# Patient Record
Sex: Male | Born: 1935 | Race: White | Hispanic: No | Marital: Married | Smoking: Former smoker
Health system: Southern US, Community
[De-identification: ages and names within clinical notes are randomized; demographics above are authoritative.]

## PROBLEM LIST (undated history)

## (undated) DIAGNOSIS — B019 Varicella without complication: Secondary | ICD-10-CM

## (undated) DIAGNOSIS — E119 Type 2 diabetes mellitus without complications: Secondary | ICD-10-CM

## (undated) DIAGNOSIS — H8109 Meniere's disease, unspecified ear: Secondary | ICD-10-CM

## (undated) HISTORY — DX: Meniere's disease, unspecified ear: H81.09

## (undated) HISTORY — DX: Type 2 diabetes mellitus without complications: E11.9

## (undated) HISTORY — PX: CATARACT EXTRACTION: SUR2

## (undated) HISTORY — PX: MUSCLE BIOPSY: SHX716

## (undated) HISTORY — DX: Varicella without complication: B01.9

---

## 2004-05-16 LAB — HM COLONOSCOPY

## 2008-04-02 ENCOUNTER — Ambulatory Visit: Payer: Self-pay | Admitting: Unknown Physician Specialty

## 2008-11-23 ENCOUNTER — Ambulatory Visit: Payer: Self-pay | Admitting: Family Medicine

## 2008-12-08 ENCOUNTER — Ambulatory Visit: Payer: Self-pay | Admitting: Family Medicine

## 2012-10-28 ENCOUNTER — Ambulatory Visit: Payer: Self-pay | Admitting: Ophthalmology

## 2012-11-25 ENCOUNTER — Ambulatory Visit: Payer: Self-pay | Admitting: Ophthalmology

## 2013-04-14 ENCOUNTER — Emergency Department: Payer: Self-pay | Admitting: Emergency Medicine

## 2014-06-29 DIAGNOSIS — G7102 Facioscapulohumeral muscular dystrophy: Secondary | ICD-10-CM | POA: Insufficient documentation

## 2014-06-29 DIAGNOSIS — R202 Paresthesia of skin: Secondary | ICD-10-CM | POA: Insufficient documentation

## 2014-08-23 ENCOUNTER — Encounter: Payer: Self-pay | Admitting: Neurology

## 2014-08-23 ENCOUNTER — Ambulatory Visit (INDEPENDENT_AMBULATORY_CARE_PROVIDER_SITE_OTHER): Payer: 59 | Admitting: Neurology

## 2014-08-23 VITALS — BP 118/70 | HR 75 | Ht 68.0 in | Wt 140.4 lb

## 2014-08-23 DIAGNOSIS — M5412 Radiculopathy, cervical region: Secondary | ICD-10-CM

## 2014-08-23 DIAGNOSIS — G830 Diplegia of upper limbs: Secondary | ICD-10-CM

## 2014-08-23 DIAGNOSIS — G1221 Amyotrophic lateral sclerosis: Secondary | ICD-10-CM

## 2014-08-23 DIAGNOSIS — G1220 Motor neuron disease, unspecified: Secondary | ICD-10-CM

## 2014-08-23 LAB — C-REACTIVE PROTEIN

## 2014-08-23 LAB — CK: Total CK: 79 U/L (ref 7–232)

## 2014-08-23 MED ORDER — DIAZEPAM 5 MG PO TABS
ORAL_TABLET | ORAL | Status: DC
Start: 2014-08-23 — End: 2015-06-27

## 2014-08-23 MED ORDER — GABAPENTIN 100 MG PO CAPS: ORAL_CAPSULE | ORAL | Status: DC

## 2014-08-23 NOTE — Patient Instructions (Signed)
1.  Check blood work today 2.  MRI of the cervical spine 3.  Start taking gabapentin 100mg  at bedtime for one week, then increase to 2 tablets at bedtime 4.  Return to clinic in 6 weeks

## 2014-08-23 NOTE — Progress Notes (Signed)
[facility] Clinic Note - Initial Visit   Date: 08/25/2014  Scott Crane MRN: [id number] DOB: 1929/12/19   Dear Dr. Melrose Nakayama:  Thank you for your kind referral of Scott Crane for consultation of bilateral hand paresthesias and arm weakness. Although his history is well known to you, please allow [place] to reiterate it for the purpose of our medical record. The patient was accompanied to the clinic by his wife and daughter who also provides collateral information.     History of Present Illness: Scott Crane is a 80 y.o. right-handed Caucasian male with diet-controlled diabetes mellitus type 2 (off medications, HbA1c 5.6), Mnire's disesase, bilateral cataracts, and degenerative arthritis presenting for evaluation of bilateral hand paresthesias and arm weakness.  Patient has a somewhat complicated neurological history regarding his arm weakness, which dates back to when he was in his 46s. He started noticing weakness of his proximal arms, and recalls having difficulty when trying to paint his home especially with higher areas. His weakness steadily worsened over the years and he also developed muscle atrophy of his proximal muscles. During the 1990s, he saw multiple physicians for evaluation. Patient provided a letter by Dr. Lavonna Rua at [facility] dated 06/29/1997 which noted that cervical canal and foraminal stenosis at that time was insufficient to explain the degree of weakness patient was experiencing and surgical decompression would likely make him worse, than better. At that time, focal motor neuron disease was considered, however since there has been no notable progression, ALS was thought to be less likely. Another letter dated 06/16/1997 by Dr. Billie Ruddy (neurosurgery) noted that patient had seen a number of doctors at both [facility] and [facility] which at that time predominantly affected the deltoid and spared the supraspinatus  muscles and biceps. He had a number of EMGs and underwent muscle biopsy which apparently ruled out muscular dystrophy. EMGs have shown a denervation pattern. At that time, he was having a pure motor problem affecting the C4-to C8 myotomes, without any sensory deficits. Since then, patient has been steadily getting worse over the years and now his weakness involves more of his proximal girdle muscles.   He had not seen a neurologist an number of years.  However in February 2015, suffered a fall after loosing his balance and was trying to sit in a chair, but landed on the floor very hard.  A few months later, he developing tingling and numbness of the right hand, worse over the thumb, index finger, and 3rd digit. He has problems with fine finger movements and manipulating small objects.  Paresthesias are constant and is worse with pronation and supination of the wrist.  He also developed new weakness of the hands.  He has no occupational therapy this year.  Numbness/tingling has steadily worsened over the past 71-month and become even worse especially over the past month, for which he went to see Dr. 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 Besides carpal tunnel syndrome, sensory responses appeared normal. Dr. PMelrose Nakayamawas also concerned about possible diagnosis of fascioscapulohumeral muscular dystrophy and referred patient here for further evaluation and management.  Currently, he complains of radicular pain starting in the neck and radiating into his hands and weakness of his hands, which was previously not present prior to his fall. He denies any problems with talking, swallowing, shortness of breath, or leg weakness. He is dependent for most  of his ADLs because of the severe weakness of his arms.  Out-side paper records, electronic medical record, and images have been  reviewed where available and summarized as:  Labs 05/06/1997: CK 92, ESR 22  EMG 05/20/1997 performed at [facility]: This study is abnormal, interpreted in conjunction with the previous study of 05/10/97. The left biceps and deltoid are severely denervated. No myopathic features were noted. This study is consistent with a severe C5-6 radiculopathic process.  This is an abnormal study. The pattern of denervation is most consistent with acute and chronic bilateral mid-cervical radiculopathy. 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   Past Medical History  Diagnosis Date  . Chickenpox   . Diabetes   . Meniere's disease     Past Surgical History  Procedure Laterality Date  . Cataract extraction       Medications:  Celebrex 200 mg daily Magnesium oxide 400 mg daily Mobic7.5 mg daily daily as needed for pain  Ultram 50 mg every 6 hours as needed for pain   Allergies: No Known Allergies  Family History: Family History  Problem Relation Age of Onset  . Stroke Mother     Social History: History   Social History  . Marital Status: Married    Spouse Name: N/A    Number of Children: N/A  . Years of Education: N/A   Occupational History  . Not on file.   Social History Main Topics  . Smoking status: Former Smoker -- 5 years  . Smokeless tobacco: Never Used  . Alcohol Use: No  . Drug Use: No  . Sexual Activity: Not on file   Other Topics Concern  . Not on file   Social History Narrative   Lives with wife in one story home.  Retired from [employer].  Education: some Teacher, music.    Review of Systems:  CONSTITUTIONAL: No fevers, chills, night sweats, or weight loss.   EYES: No visual changes or eye pain ENT: No hearing changes.  No history of nose bleeds.   RESPIRATORY: No cough, wheezing and shortness of breath.     CARDIOVASCULAR: Negative for chest pain, and palpitations.   GI: Negative for abdominal discomfort, blood in stools or black stools.  No recent change in bowel habits.   GU:  No history of incontinence.   MUSCLOSKELETAL: +history of joint pain or swelling.  No myalgias.   SKIN: Negative for lesions, rash, and itching.   HEMATOLOGY/ONCOLOGY: Negative for prolonged bleeding, bruising easily, and swollen nodes.  No history of cancer.   ENDOCRINE: Negative for cold or heat intolerance, polydipsia or goiter.   PSYCH:  No depression or anxiety symptoms.   NEURO: As Above.   Vital Signs:  BP 118/70 mmHg  Pulse 75  Ht 5' 8" (1.727 m)  Wt 140 lb 7 oz (63.702 kg)  BMI 21.36 kg/m2  SpO2 97%   General Medical Exam:   General:   Very thin-appearing gentleman, comfortable.  Eyes/ENT: see cranial nerve examination.   Neck: No masses appreciated.  Full range of motion without tenderness.  No carotid bruits. Respiratory:  Clear to auscultation, good air entry bilaterally.   Cardiac:  Regular rate and rhythm, no murmur.   Extremities:  No deformities, edema, or skin discoloration.  Skin:  No rashes or lesions.  Neurological Exam: MENTAL STATUS including orientation to time, place, person, recent and remote memory, attention  span and concentration, language, and fund of knowledge is normal.  Speech is not dysarthric.  Lingual and gutteral sounds are normal.  CRANIAL NERVES: II:  No visual field defects.  Unremarkable fundi.   III-IV-VI: Pupils equal round and reactive to light.  Normal conjugate, extra-ocular eye movements in all directions of gaze.  No nystagmus.  No ptosis.   V:  Normal facial sensation.  Jaw jerk is absent VII:  Normal facial symmetry and movements.  Frontalis, oribicularis oculi, orbicularis oris, and buccinator is 5/5.  Snout and palmomental reflex is absent.  Myerson's sign is present. VIII:  Reduced finger rub bilaterally (L >R).     IX-X:  Normal palatal movement.   XI:   Normal shoulder shrug and head rotation.   XII:  Normal tongue strength and range of motion, no deviation or fasciculation.  MOTOR:  Very severe muscle atrophy of the periscapular muscles (supraspinatus, infraspinatus, medial pectoralis, paraspinal muscles, biceps) and cervical as well as thoracic paraspinal muscles.  Rare intermittent fasiculations of the right forearm muscles.   Tone is reduced in the arms and normal in the legs..    Right Upper Extremity:    Left Upper Extremity:    Deltoid  1/5   Deltoid  1/5   Biceps  1/5   Biceps  1/5   Infraspinatus 0/5  Infraspinatus 0/5  Medial pectoralis 3/5  Medial pectoralis 4-/5  Serratus anterior 0/5  Serratus anterior 0/5  Brachioradialis 1/5  Brachioradialis 2/5  Triceps  5-/5   Triceps  5-/5   Wrist extensors  5/5   Wrist extensors  5/5   Wrist flexors  5/5   Wrist flexors  5/5   Finger extensors  5/5   Finger extensors  5/5   Finger flexors  5/5   Finger flexors  5/5   Dorsal interossei  4+/5   Dorsal interossei  5-/5   Abductor pollicis  5-/5   Abductor pollicis  5-/5   Tone (Ashworth scale)  0  Tone (Ashworth scale)  0   Right Lower Extremity:    Left Lower Extremity:    Hip flexors  5/5   Hip flexors  5/5   Hip extensors  5/5   Hip extensors  5/5   Adductor 5/5  Adductor 5/5  Abductor 5/5  Abductor 5/5  Knee flexors  5/5   Knee flexors  5/5   Knee extensors  5/5   Knee extensors  5/5   Dorsiflexors  5/5   Dorsiflexors  5/5   Plantarflexors  5/5   Plantarflexors  5/5   Toe extensors  5/5   Toe extensors  5/5   Toe flexors  5/5   Toe flexors  5/5   Tone (Ashworth scale)  0  Tone (Ashworth scale)  0   MSRs:  Right                                                                 Left brachioradialis 2+  brachioradialis 1+  biceps 2+  biceps 3+  triceps 3+  triceps 3+  patellar 3+  patellar 2+  ankle jerk 1+  ankle jerk 1+  Hoffman no  Hoffman no  plantar response down  plantar response up  Right crossed adductors Biceps  reflex  initiates shoulder shrug and medial pectoralis spread  SENSORY:  Vibration reduced over the hands and ankles bilaterally. Pinprick is reduced in the forearms bilaterally.   COORDINATION/GAIT:  He is unable to perform finger to nose. Finger tapping is intact bilaterally.  Gait narrow based and stable, there is no arm swing due to weakness.    IMPRESSION: Scott Crane is an 80 year-old gentleman presenting for evaluation of bilateral proximal arm weakness and muscle atrophy with new symptoms of cervical radicular pain. His history is somewhat complicated and he has had extensive workup including EMGs and muscle biopsy for his muscle atrophy and weakness which has remained unclear. 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 It appears that there has been no evidence of myopathy on any of his EMGs or muscle biopsy. Additionally, his CKs have always been normal. With this in mind, a muscular dystrophy such as FSHD would be less likely.  Further, there is no family history for this, facial muscles are intact, and early in the disease cause deltoid muscles are usually spared which is not the case here since this was a presenting symptom.  More so, given the severe muscle atrophy, I would expect his reflexes to be low, but instead, his reflexes are actually increased. This may be due to underlying cervical canal stenosis, however involvement of upper motor neurons must also be considered. 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 This variant tends to be rare, slowly progressive and primarily lower motor neuron with without always progressive to involve the face, respiratory muscles, or legs. Repeat electrodiagnostic testing of the face, arm, and legs may be indicated going forward.  At this time, his primary complaint is severe pain and paresthesias of his  upper extremities. The characteristic of his pain appears to be more radicular in quality so I would like to obtain updated imaging of the cervical spine. I would not be surprised if there is new focal nerve root impingement.   PLAN/RECOMMENDATIONS:  1.  Check CK, ESR, CRP, TSH, vitamin B12, copper, zinc, vitamin E 2.  MRI of the cervical spine 3.  Start gabapentin 100 mg at bedtime for one week, then increase to 2 tablets at bedtime. Up titrate as tolerable. 4.  Consider neck PT and/or OT going forward 5.  Return to clinic in 6 weeks    The duration of this appointment visit was 80 minutes of face-to-face time with the patient.  Greater than 50% of this time was spent in counseling, explanation of diagnosis, planning of further management, and coordination of care.   Thank you for allowing me to participate in patient's care.  If I can answer any additional questions, I would be pleased to do so.    Sincerely,    Donika K. Posey Pronto, DO

## 2014-08-24 LAB — SEDIMENTATION RATE: Sed Rate: 4 mm/hr (ref 0–16)

## 2014-08-24 LAB — TSH: TSH: 0.932 u[IU]/mL (ref 0.350–4.500)

## 2014-08-24 LAB — VITAMIN B12: Vitamin B-12: 389 pg/mL (ref 211–911)

## 2014-08-25 DIAGNOSIS — G1221 Amyotrophic lateral sclerosis: Secondary | ICD-10-CM | POA: Insufficient documentation

## 2014-08-25 LAB — COPPER, SERUM: Copper: 91 ug/dL (ref 70–175)

## 2014-08-26 LAB — ZINC: ZINC: 63 ug/dL (ref 60–130)

## 2014-08-26 LAB — VITAMIN E
Gamma-Tocopherol (Vit E): 1.2 mg/L (ref <=4.3)
VITAMIN E (ALPHA TOCOPHEROL): 11.1 mg/L (ref 5.7–19.9)

## 2014-08-26 NOTE — Progress Notes (Signed)
Note faxed.

## 2014-09-05 ENCOUNTER — Ambulatory Visit: Payer: Self-pay | Admitting: Neurology

## 2014-09-16 ENCOUNTER — Telehealth: Payer: Self-pay | Admitting: *Deleted

## 2014-09-16 NOTE — Telephone Encounter (Signed)
Patient requesting result from his most recent imaging done at [facility] Call back number [phone] [facility])

## 2014-09-16 NOTE — Telephone Encounter (Signed)
Did you get these results yet?

## 2014-09-16 NOTE — Telephone Encounter (Signed)
MRI cervical spine wo contrast 09/05/2014 from [facility]: 1.  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 2.  Slight spinal cord compression due to disc osteophyte complex at c3-4 asymmetric to the right.  Te right C4 nerves could be affected. 3.  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  Results discuss with Vickie and requested to bring CD to their f/u appointment.  Talia Hoheisel K. Allena Katz, DO

## 2014-09-16 NOTE — Telephone Encounter (Signed)
No results rec'd.  Can you call [facility] and ask them to fax it?

## 2014-09-27 ENCOUNTER — Ambulatory Visit: Payer: Self-pay | Admitting: Family Medicine

## 2014-10-04 ENCOUNTER — Ambulatory Visit: Payer: Self-pay | Admitting: Neurology

## 2014-11-07 ENCOUNTER — Encounter: Payer: Self-pay | Admitting: Neurology

## 2014-11-07 ENCOUNTER — Ambulatory Visit (INDEPENDENT_AMBULATORY_CARE_PROVIDER_SITE_OTHER): Payer: 59 | Admitting: Neurology

## 2014-11-07 VITALS — BP 118/80 | HR 73 | Ht 68.0 in | Wt 143.4 lb

## 2014-11-07 DIAGNOSIS — M4712 Other spondylosis with myelopathy, cervical region: Secondary | ICD-10-CM

## 2014-11-07 DIAGNOSIS — M5412 Radiculopathy, cervical region: Secondary | ICD-10-CM

## 2014-11-07 DIAGNOSIS — G1221 Amyotrophic lateral sclerosis: Secondary | ICD-10-CM

## 2014-11-07 MED ORDER — GABAPENTIN 100 MG PO CAPS: 100.0000 mg | ORAL_CAPSULE | Freq: Three times a day (TID) | ORAL | Status: DC

## 2014-11-07 NOTE — Patient Instructions (Signed)
1.  We will send a referral to Physical Medicine and Rehab doctors  2.  Return to clinic as needed

## 2014-11-07 NOTE — Progress Notes (Signed)
Follow-up Visit   Date: 11/07/2014   MARSALIS BEAULIEU MRN: [id number] DOB: November 18, 1929   Interim History: Scott Crane is a 81 y.o. right-handed Caucasian male with diet-controlled diabetes mellitus type 2 (off medications, HbA1c 5.6), Mnire's disesase, bilateral cataracts, and degenerative arthritis returning to the clinic for follow-up of bilateral hand weakness and neck pain.  The patient was accompanied to the clinic by wife and daughter who also provides collateral information.    History of present illness: Patient has a somewhat complicated neurological history regarding his arm weakness, which dates back to when he was in his 16s. He started noticing weakness of his proximal arms, and recalls having difficulty when trying to paint his home especially with higher areas. His weakness steadily worsened over the years and he also developed muscle atrophy of his proximal muscles. During the 1990s, he saw multiple physicians for evaluation. Patient provided a letter by Dr. Lavonna Rua at [facility] dated 06/29/1997 which noted that cervical canal and foraminal stenosis at that time was insufficient to explain the degree of weakness patient was experiencing and surgical decompression would likely make him worse, than better. At that time, focal motor neuron disease was considered, however since there has been no notable progression, ALS was thought to be less likely. Another letter dated 06/16/1997 by Dr. Billie Ruddy (neurosurgery) noted that patient had seen a number of doctors at both [facility] and [place] centers which at that time predominantly affected the deltoid and spared the supraspinatus muscles and biceps. He had a number of EMGs and underwent muscle biopsy which apparently ruled out muscular dystrophy. EMGs have shown a denervation pattern. At that time, he was having a pure motor problem affecting the C4-to C8 myotomes, without any sensory deficits. Since then, patient  has been steadily getting worse over the years and now his weakness involves more of his proximal girdle muscles.   He had not seen a neurologist an number of years. However in February 2015, suffered a fall after loosing his balance and was trying to sit in a chair, but landed on the floor very hard. A few months later, he developing tingling and numbness of the right hand, worse over the thumb, index finger, and 3rd digit. He has problems with fine finger movements and manipulating small objects. Paresthesias are constant and is worse with pronation and supination of the wrist. He also developed new weakness of the hands. He has no occupational therapy this year. Numbness/tingling has steadily worsened over the past 44-month and become even worse especially over the past month, for which he went to see Dr. 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 Besides carpal tunnel syndrome, sensory responses appeared normal. Dr. PMelrose Nakayamawas also concerned about possible diagnosis of fascioscapulohumeral muscular dystrophy and referred patient here for further evaluation and management.  He complains of radicular pain starting in the neck and radiating into his hands and weakness of his hands, which was previously not present prior to his fall. He denies any problems with talking, swallowing, shortness of breath, or leg weakness. He is dependent for most of his ADLs because of the severe weakness of his arms  UPDATE 11/07/2014: Patient underwent MRI cervical spine in December 2015 which showed cord compression at C1-2 with myelopathy which is most likely causing his new radicular pain involving the neck and hands.  He feels that symptoms have overall stabilized without  any worsening since his last visit.  He continues to have severe radicular pain involving the neck and low back,  numbness involving the thumb and index fingers, and weakness with elbow flexion. Since starting neurontin, he wakes up several times at night to urinate and is wondering if this could be a medication effect.  Medications:  Current Outpatient Prescriptions on File Prior to Visit  Medication Sig Dispense Refill  . acetaminophen (RA ACETAMINOPHEN) 650 MG CR tablet Take by mouth.    . celecoxib (CELEBREX) 200 MG capsule Take by mouth.    . diazepam (VALIUM) 5 MG tablet Take one tablet 30-min prior to MRI. 2 tablet 0  . Glucosamine-Chondroit-Vit C-Mn (GLUCOSAMINE-CHONDROITIN) CAPS Take by mouth.    . magnesium oxide (MAG-OX) 400 MG tablet Take by mouth.    . meclizine (ANTIVERT) 25 MG tablet Take by mouth.    . meloxicam (MOBIC) 7.5 MG tablet Take by mouth.    . Probiotic Product (PROBIOTIC DAILY PO) Take by mouth.    . traMADol (ULTRAM) 50 MG tablet Take by mouth.     No current facility-administered medications on file prior to visit.    Allergies: No Known Allergies  Review of Systems:  CONSTITUTIONAL: No fevers, chills, night sweats, or weight loss.  EYES: No visual changes or eye pain ENT: No hearing changes.  No history of nose bleeds.   RESPIRATORY: No cough, wheezing and shortness of breath.   CARDIOVASCULAR: Negative for chest pain, and palpitations.   GI: Negative for abdominal discomfort, blood in stools or black stools.  No recent change in bowel habits.   GU:  No history of incontinence.   MUSCLOSKELETAL: +history of joint pain or swelling.  No myalgias.   SKIN: Negative for lesions, rash, and itching.   ENDOCRINE: Negative for cold or heat intolerance, polydipsia or goiter.   PSYCH:  No depression or anxiety symptoms.   NEURO: As Above.   Vital Signs:  BP 118/80 mmHg  Pulse 73  Ht _0  (1.727 m)  Wt 143 lb 6 oz (65.034 kg)  BMI 21.80 kg/m2  SpO2 97%  Neurological Exam: MENTAL STATUS including orientation to time, place, person, recent and remote memory, attention  span and concentration, language, and fund of knowledge is normal.  Very talkative, speech is not dysarthric.  CRANIAL NERVES:  Pupils equal round and reactive to light.  Normal conjugate, extra-ocular eye movements in all directions of gaze.  No ptosis. Normal facial sensation.  Face is symmetric and facial muscles are intact. Palate elevates symmetrically.  Tongue is midline.  MOTOR:  Very severe muscle atrophy of the periscapular muscles (supraspinatus, infraspinatus, medial pectoralis, paraspinal muscles, biceps) and cervical as well as thoracic paraspinal muscles. Rare intermittent fasiculations of the right forearm muscles.   Tone is reduced in the arms and normal in the legs..      Right Upper Extremity:       Left Upper Extremity:      Deltoid   1/5     Deltoid   1/5    Biceps   1/5     Biceps   1/5    Infraspinatus  0/5    Infraspinatus  0/5   Medial pectoralis  3/5    Medial pectoralis  4-/5   Serratus anterior  0/5    Serratus anterior  0/5   Brachioradialis  1/5    Brachioradialis  2/5   Triceps   5-/5     Triceps   5-/5  Wrist extensors   5/5     Wrist extensors   5/5    Wrist flexors   5/5     Wrist flexors   5/5    Finger extensors   5/5     Finger extensors   5/5    Finger flexors   5/5     Finger flexors   5/5    Dorsal interossei   4+/5     Dorsal interossei   5-/5    Abductor pollicis   5-/5     Abductor pollicis   5-/5    Tone (Ashworth scale)   0    Tone (Ashworth scale)   0      Right Lower Extremity:       Left Lower Extremity:      Hip flexors   5/5     Hip flexors   5/5    Hip extensors   5/5     Hip extensors   5/5    Adductor  5/5    Adductor  5/5   Abductor  5/5    Abductor  5/5   Knee flexors   5/5     Knee flexors   5/5    Knee extensors   5/5     Knee extensors   5/5    Dorsiflexors   5/5     Dorsiflexors   5/5    Plantarflexors   5/5     Plantarflexors   5/5    Toe extensors   5/5     Toe extensors   5/5    Toe flexors   5/5     Toe flexors   5/5      Tone (Ashworth scale)   0    Tone (Ashworth scale)   0    MSRs:   Right                                                                 Left brachioradialis  2+    brachioradialis  1+   biceps  2+    biceps  3+   triceps  3+    triceps  3+   patellar  3+    patellar  2+   ankle jerk  1+    ankle jerk  1+   Hoffman  no    Hoffman  no   plantar response  down    plantar response  up   Right crossed adductors Biceps reflex initiates shoulder shrug and medial pectoralis spread  SENSORY:  Vibration reduced over the hands and ankles bilaterally. Pinprick is reduced in the forearms bilaterally.    COORDINATION/GAIT:   Gait narrow based and stable, there is no arm swing due to weakness.    Data: Labs 05/06/1997: CK 92, ESR 22  Labs 08/23/2014:  CK 79, ESR 4, CRP <0.5, TSH 0.932, vitamin B12 389, zinc 63, copper 91, vitamin E 1.2  EMG 05/20/1997 performed at [facility]: This study is abnormal, interpreted in conjunction with the previous study of 05/10/97. The left biceps and deltoid are severely denervated. No myopathic features were noted. This study is consistent with a severe C5-6 radiculopathic process.  This is an abnormal study. The pattern of denervation is most  consistent with acute and chronic bilateral mid-cervical radiculopathy. 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  MRI cervical spine wo contrast 09/05/2014 from [facility]: 1. 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 2. Slight spinal cord compression due to disc osteophyte complex at c3-4 asymmetric to the right. The right C4 nerves could be affected. 3. Auto fusion of C4-5 and C5-6  IMPRESSION: Mr. Sprinkle is an 81 year-old gentleman returning for evaluation of bilateral proximal arm weakness and muscle atrophy with new symptoms of cervical  radicular pain. His history is somewhat complicated and he has had extensive workup including EMGs and muscle biopsy for his muscle atrophy and weakness which has remained unclear. 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 It appears that there has been no evidence of myopathy on any of his EMGs or muscle biopsy. Additionally, his CKs have always been normal. With this in mind, a muscular dystrophy such as FSHD would be less likely. Further, there is no family history for this, facial muscles are intact, and early in the disease cause deltoid muscles are usually spared which is not the case here since this was a presenting symptom.   More so, given the severe muscle atrophy, I would expect his reflexes to be low, but instead, his reflexes are actually increased. This is due to underlying cervical canal stenosis. 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 This variant tends to be rare, slowly progressive and primarily lower motor neuron with without always progressive to involve the face, respiratory muscles, or legs.   He does have new complains of neck pain, upper extremity paresthesias, and weakness for which imaging of the cervical spine was obtained and shows cord compression at C1-2 and myelopathy at C2-3 and C5-6.  His current neck pain, paresthesias, and progressive weakness of elbow flexion is due to cervical canal stenosis.  The severity of this is unlikely to be amenable to surgery and patient is not interested in seeking surgical opinion at this time. Instead, I will pursue conservative therapy and refer him to PM&R for evaluation and management given the complexity of his presentation and weakness.  I explained that the goal of therapy going forward is to limit further weakness and control pain.     PLAN/RECOMMENDATIONS:  1.  Referral to PM&R  given his complex history prior to PT 2.  OK to hold neurontin for one week to see if this helps urinary symptoms, but given low dose, this is unlikely to be a medication effect.  If no change, increase neurontin to 114m three times daily 3.  Return to clinic as needed   The duration of this appointment visit was 35 minutes of face-to-face time with the patient.  Greater than 50% of this time was spent in counseling, explanation of diagnosis, planning of further management, and coordination of care.   Thank you for allowing me to participate in patient's care.  If I can answer any additional questions, I would be pleased to do so.    Sincerely,    Abanoub Hanken K. PPosey Pronto DO

## 2014-11-07 NOTE — Progress Notes (Signed)
Note faxed.

## 2014-11-15 ENCOUNTER — Telehealth: Payer: Self-pay | Admitting: Neurology

## 2014-11-15 NOTE — Telephone Encounter (Signed)
Vicki is returning your call  °

## 2014-11-15 NOTE — Telephone Encounter (Signed)
Pt daughter [name] called and needs to talk to someone about a referral that we did for her father. Please call [name] at [phone]

## 2014-11-15 NOTE — Telephone Encounter (Signed)
Called [name] and left message for her to call me back.

## 2014-11-16 NOTE — Telephone Encounter (Signed)
[facility] denied patient and suggested that he go back to [facility].  His daughter called and said that she thinks that he may just need to see a hand specialist.  She also said that [facility] is too far for them to drive.  Can I send them to [facility]?  Please advise.

## 2014-11-16 NOTE — Telephone Encounter (Signed)
Notified Vickie that I will fax over referral to [facility].

## 2014-11-16 NOTE — Telephone Encounter (Signed)
Yes, that works. Scott Round K. Allena Katz, DO

## 2014-11-17 ENCOUNTER — Telehealth: Payer: Self-pay | Admitting: *Deleted

## 2014-11-17 NOTE — Telephone Encounter (Signed)
[facility] called to schedule with this patient today but hi daughter (vicky) would like to be seen in [place]

## 2014-11-18 ENCOUNTER — Other Ambulatory Visit: Payer: Self-pay | Admitting: *Deleted

## 2014-11-18 DIAGNOSIS — R29898 Other symptoms and signs involving the musculoskeletal system: Secondary | ICD-10-CM

## 2014-11-18 NOTE — Telephone Encounter (Signed)
Note faxed to [place].  Vickie aware.

## 2014-12-06 ENCOUNTER — Encounter
Admit: 2014-12-06 | Disposition: A | Discharge status: Home / Self Care | Payer: Self-pay | Attending: Neurology | Admitting: Neurology

## 2014-12-09 ENCOUNTER — Encounter
Admit: 2014-12-09 | Disposition: A | Discharge status: Home / Self Care | Payer: Self-pay | Attending: Neurology | Admitting: Neurology

## 2015-01-11 ENCOUNTER — Ambulatory Visit: Payer: Medicare Other | Attending: Family Medicine | Admitting: Physical Therapy

## 2015-01-11 ENCOUNTER — Encounter: Payer: Self-pay | Admitting: Physical Therapy

## 2015-01-11 DIAGNOSIS — M542 Cervicalgia: Secondary | ICD-10-CM | POA: Insufficient documentation

## 2015-01-11 DIAGNOSIS — M62838 Other muscle spasm: Secondary | ICD-10-CM | POA: Diagnosis not present

## 2015-01-11 DIAGNOSIS — M4802 Spinal stenosis, cervical region: Secondary | ICD-10-CM | POA: Insufficient documentation

## 2015-01-11 DIAGNOSIS — M6281 Muscle weakness (generalized): Secondary | ICD-10-CM | POA: Insufficient documentation

## 2015-01-11 NOTE — Therapy (Signed)
[facility] [address] [place], [address] Phone: [phone]   Fax:  [phone]  Physical Therapy Treatment  Patient Details  Name: Scott Crane MRN: [id number] Date of Birth: 1930-04-23 Referring Provider:  Maple Hudson.,*  Encounter Date: 01/11/2015      PT End of Session - 01/11/15 1550    Visit Number 3   Number of Visits 6   Date for PT Re-Evaluation 02/07/15   Authorization Type 3   Authorization Time Period 10   PT Start Time 1448   PT Stop Time 1543   PT Time Calculation (min) 55 min   Activity Tolerance Patient tolerated treatment well   Behavior During Therapy [facility] for tasks assessed/performed      Past Medical History  Diagnosis Date  . Chickenpox   . Diabetes   . Meniere's disease     Past Surgical History  Procedure Laterality Date  . Cataract extraction      There were no vitals filed for this visit.  Visit Diagnosis:  Cervicalgia  Muscle spasm  Muscle weakness (generalized)      Subjective Assessment - 01/11/15 1506    Subjective right and left hands are feeling bettern and can bend forward with head without sharp pain in neck now   Patient Stated Goals to be able to drive better and to decrease pain in both UE's to hands   Currently in Pain? No/denies            [facility] PT Assessment - 01/11/15 0001    Assessment   Medical Diagnosis spinal stenosis, cervical region    Onset Date 04/10/15   Next MD Visit 02/07/2015   Balance Screen   Has the patient fallen in the past 6 months No   Has the patient had a decrease in activity level because of a fear of falling?  No   Is the patient reluctant to leave their home because of a fear of falling?  No           SUBJECTIVE: Patient reports he is getting better with decreased symptoms into both hands and feels therapy is helping with estim. And manual traction. He is exercising at home as instructed.    PAIN (with rating/descriptors) tingling and numbness into right hand > left now, 4/10  OBJECTIVE: Treatment:  1.Manual therapy techniques for cervical traction x 10 with 5 second holds for soft tissue stretching, trigger point massage to right upper trapezius 2. sitting exercises for strengthening upper back: manual resistive scapular adduction x 10 reps with 5 second holds, isometric holds x 5 seconds x 5 reps for cervical spine extension with home instruction, discussed patient beginning use of stationary bike at home and he will start out 5 min. Once a day and progress to 2x/day if neck and UE's are not aggravated 3. high volt estim. 4 electrodes applied by therapist across both upper trapezius muscles and medial border of both scapulae with moist heat applied to same with patient seated in chair with back supported and UE's supported   Pt response for medical necessity:Improved soft tissue elasticity right upper trapezius muscle with manual therapy and high volt estim. Required additional instruction for proper technique and progression of exercises. Improving posture with decreased forward head noted following exercise/manual therapy techniques.                    PT Education - 01/11/15 1526  Education provided Yes   Education Details instructed in posture, decrease forward head, support arms with pillows when sitting   Person(s) Educated Patient;Spouse   Methods Explanation;Demonstration   Comprehension Verbalized understanding             PT Long Term Goals - 01/11/15 1601    PT LONG TERM GOAL #1   Title pt. will improve quickDASH to 50% to demonstrate improvement with self perceived disability with ADL's   Baseline 72.5% initially 12/27/2014   Time 6   Period Weeks   Status New   PT LONG TERM GOAL #2   Title pt. will report decreased pain level to max 4-5/10 with aggravating activities   Baseline pain scale 8/10 and constant 12/27/2014   Time 6   Period  Weeks   Status New   PT LONG TERM GOAL #3   Title Patient will improve posture awareness with decreased forward head posture to mild and no soreness into left and right UE   Time 6   Period Weeks   Status New   PT LONG TERM GOAL #4   Title Patient will be independent with home exercise program for prevention/ self-management of neck pain symptoms/exercise    Time 6   Period Weeks   Status New               Plan - 01/11/15 1554    Clinical Impression Statement Patient demonstrated decreased tone and spasms with treatment to right upper trapezius. He is compliant with home program and required additional instruction for progressive exercise for strength and improved posture and continues with symptoms into both hands. He will  benefit from additional physical therapy intervention to further decrease spasms which may improve UE symptoms and return patient to close to previous level of function.    Pt will benefit from skilled therapeutic intervention in order to improve on the following deficits Postural dysfunction;Impaired UE functional use;Increased muscle spasms;Decreased strength   Rehab Potential Fair   Clinical Impairments Affecting Rehab Potential chronic condition, degree of weaknessa and atrophy in shoulders and UE's   PT Frequency 1x / week   PT Duration 6 weeks   PT Next Visit Plan pain control, reduction of spasms with manual therapy and high volt estim. , progress exercises as indicated   Consulted and Agree with Plan of Care Patient        Problem List Patient Active Problem List   Diagnosis Date Noted  . Spondylosis, cervical, with myelopathy 11/07/2014  . 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 [place], [address] Phone: [phone]   Fax:  [phone]

## 2015-01-11 NOTE — Patient Instructions (Signed)
3x/day: isometric cervical spine extension 5 seconds holds x 5 repetitions 3x/day: scapular adduction 5-10 reps

## 2015-01-17 ENCOUNTER — Encounter: Payer: Self-pay | Admitting: Physical Therapy

## 2015-01-17 ENCOUNTER — Ambulatory Visit: Payer: Medicare Other | Admitting: Physical Therapy

## 2015-01-17 DIAGNOSIS — M62838 Other muscle spasm: Secondary | ICD-10-CM

## 2015-01-17 DIAGNOSIS — M542 Cervicalgia: Secondary | ICD-10-CM

## 2015-01-17 DIAGNOSIS — M4802 Spinal stenosis, cervical region: Secondary | ICD-10-CM | POA: Diagnosis not present

## 2015-01-17 DIAGNOSIS — M6281 Muscle weakness (generalized): Secondary | ICD-10-CM

## 2015-01-17 LAB — HEPATIC FUNCTION PANEL
ALK PHOS: 94 U/L (ref 25–125)
ALT: 13 U/L (ref 10–40)
AST: 19 U/L (ref 14–40)

## 2015-01-17 LAB — CBC AND DIFFERENTIAL
HCT: 38 % — AB (ref 41–53)
HEMOGLOBIN: 13.3 g/dL — AB (ref 13.5–17.5)
Neutrophils Absolute: 3 /uL
Platelets: 211 10*3/uL (ref 150–399)
WBC: 4 10*3/mL

## 2015-01-17 LAB — BASIC METABOLIC PANEL
BUN: 25 mg/dL — AB (ref 4–21)
CREATININE: 1 mg/dL (ref 0.6–1.3)
Glucose: 94 mg/dL
Potassium: 5.3 mmol/L (ref 3.4–5.3)
SODIUM: 141 mmol/L (ref 137–147)

## 2015-01-17 LAB — HEMOGLOBIN A1C: Hgb A1c MFr Bld: 6 % (ref 4.0–6.0)

## 2015-01-17 LAB — LIPID PANEL
Cholesterol: 149 mg/dL (ref 0–200)
HDL: 56 mg/dL (ref 35–70)
LDL Cholesterol: 79 mg/dL
LDl/HDL Ratio: 1.4
TRIGLYCERIDES: 71 mg/dL (ref 40–160)

## 2015-01-17 LAB — TSH: TSH: 1.29 u[IU]/mL (ref 0.41–5.90)

## 2015-01-17 NOTE — Therapy (Signed)
[place] [facility] [address] [place], [address] Phone: [phone]   Fax:  [phone]  Physical Therapy Treatment  Patient Details  Name: Scott Crane MRN: [id number] Date of Birth: 03-22-1930 Referring Provider:  Maple Crane.,*  Encounter Date: 01/17/2015      PT End of Session - 01/17/15 1617    Visit Number 4   Number of Visits 6   Date for PT Re-Evaluation 02/07/15   Authorization Type 4   Authorization Time Period 10   PT Start Time 1535   PT Stop Time 1622   PT Time Calculation (min) 47 min   Activity Tolerance Patient tolerated treatment well   Behavior During Therapy [facility] for tasks assessed/performed      Past Medical History  Diagnosis Date  . Chickenpox   . Diabetes   . Meniere's disease     Past Surgical History  Procedure Laterality Date  . Cataract extraction      There were no vitals filed for this visit.  Visit Diagnosis:  Cervicalgia  Muscle spasm  Muscle weakness (generalized)      Subjective Assessment - 01/17/15 1614    Subjective right and left hands are feeling bettern and can bend forward with head without sharp pain in neck now. His symptoms have not gotten any worse and overall he does feel he is getting better. chief complaint today is right UE shoulder stiffness and into right UE,  feeling of numbness in right hand, no worse than previous session.   Patient is accompained by: Family member  daughter   Patient Stated Goals to be able to drive better and to decrease pain in both UE's to hands   Currently in Pain? No/denies         OBJECTIVE: Treatment:  1.Manual therapy techniques for cervical traction x 10 reps with 5 second holds for soft tissue stretching, trigger point massage to right upper trapezius and left upper trapezius with patient in seated position in chair with back supported 2.Therapeutic exercise: sitting exercises for  strengthening upper back: manual resistive scapular adduction x 10 reps with 5 second holds, isometric holds x 5 seconds x 5 reps for cervical spine extension with additional home instruction and how to perform in car or in recliner chair; previous session discussed patient beginning use of stationary bike at home and he will start out 5 min. Once a day and progress to 2x/day if neck and UE's are not aggravated result patient reports the bike aggravated his right UE symptoms and he discontinued it for now. 3. high volt estim. 4 electrodes applied by therapist across both upper trapezius muscles and medial border of both scapulae with moist heat applied to same with patient seated in chair with back supported and UE's supported on pillows with pillows having to be re positioned on right due to increased numbness reported when arm was in more abduction  Pt response for medical necessity:Improved soft tissue elasticity right upper trapezius muscle with manual traction/soft tissue mobilization and patient was able to perform exercises with verbal and tactile cues with improved technique and strength noted with repetition. Stiffness decreased by at least 50% following treatment        PT Long Term Goals - 01/11/15 1601    PT LONG TERM GOAL #1   Title pt. will improve quickDASH to 50% to demonstrate improvement with self perceived disability with ADL's   Baseline 72.5% initially 12/27/2014  Time 6   Period Weeks   Status New   PT LONG TERM GOAL #2   Title pt. will report decreased pain level to max 4-5/10 with aggravating activities   Baseline pain scale 8/10 and constant 12/27/2014   Time 6   Period Weeks   Status New   PT LONG TERM GOAL #3   Title Patient will improve posture awareness with decreased forward head posture to mild and no soreness into left and right UE   Time 6   Period Weeks   Status New   PT LONG TERM GOAL #4   Title Patient will be independent with home exercise  program for prevention/ self-management of neck pain symptoms/exercise    Time 6   Period Weeks   Status New            Plan - 01/17/15 1626    Clinical Impression Statement Patient demonstrates decreasing trigger point in right upper trapezius and improvement with decreased symptoms of stiffness and pain into both hands. He is complinat with home progra and requried additional instruction for progressive exercise for isometreic extensioin cervical spine, scapular adduction exercises and proper positioning of UE's to decrease strain on shoulder when sitting. He will benefit from additional  physical therapy intervention to further decrease spasms which may improve UE symptome and return patient to previous level of function.   Pt will benefit from skilled therapeutic intervention in order to improve on the following deficits Postural dysfunction;Impaired UE functional use;Increased muscle spasms;Decreased strength   Rehab Potential Fair   Clinical Impairments Affecting Rehab Potential chronic condition, degree of weaknessa and atrophy in shoulders and UE's   PT Frequency 1x / week   PT Duration 6 weeks   PT Treatment/Interventions Passive range of motion;Therapeutic exercise;Moist Heat;Electrical Stimulation;Manual techniques;Patient/family education;Ultrasound   PT Next Visit Plan pain control, reduction of spasms with manual therapy and high volt estim. , progress exercises as indicated        Problem List Patient Active Problem List   Diagnosis Date Noted  . Spondylosis, cervical, with myelopathy 11/07/2014  . Brachial amyotrophic diplegia 08/25/2014   [name], PT  01/17/2015, 4:37 PM  [facility] PH[facility] SPORTS MEDICINE [address], [place], [address] Phone: [phone]   Fax:  [phone]

## 2015-01-24 ENCOUNTER — Ambulatory Visit: Payer: Medicare Other | Admitting: Physical Therapy

## 2015-01-31 ENCOUNTER — Encounter: Payer: Self-pay | Admitting: Physical Therapy

## 2015-01-31 ENCOUNTER — Ambulatory Visit: Payer: Medicare Other | Admitting: Physical Therapy

## 2015-01-31 DIAGNOSIS — M6281 Muscle weakness (generalized): Secondary | ICD-10-CM

## 2015-01-31 DIAGNOSIS — M542 Cervicalgia: Secondary | ICD-10-CM

## 2015-01-31 DIAGNOSIS — M4802 Spinal stenosis, cervical region: Secondary | ICD-10-CM | POA: Diagnosis not present

## 2015-01-31 DIAGNOSIS — M62838 Other muscle spasm: Secondary | ICD-10-CM

## 2015-02-01 NOTE — Therapy (Signed)
[facility]ICINE [address], [place], [address] Phone: [phone]   Fax:  [phone]  Physical Therapy Treatment  Patient Details  Name: Scott Crane MRN: [id number] Date of Birth: [birth year] Referring Provider:  Glendale Chard, DO  Encounter Date: 01/31/2015      PT End of Session - 01/31/15 1625    Visit Number 5   Number of Visits 6   Date for PT Re-Evaluation 02/07/15   Authorization Type 5   Authorization Time Period 10   PT Start Time 1533   PT Stop Time 1620   PT Time Calculation (min) 47 min   Activity Tolerance Patient tolerated treatment well   Behavior During Therapy [facility] for tasks assessed/performed      Past Medical History  Diagnosis Date  . Chickenpox   . Diabetes   . Meniere's disease     Past Surgical History  Procedure Laterality Date  . Cataract extraction      There were no vitals filed for this visit.  Visit Diagnosis:  Cervicalgia  Muscle spasm  Muscle weakness (generalized)      Subjective Assessment - 01/31/15 1538    Subjective Paitent reports that he wrote a lot of bills over the weekend which he was not able to do since the falls he had 8 months ago. He currently reports that his right hand still gets stiff and he cannot rotate arm outward without increased stiffness/pressure felt in right wrist. This symptom is now less intense and less frequent than prior to beginning therapy treatment   Patient is accompained by: Family member  wife   Patient Stated Goals to be able to drive better and to decrease pain in both UE's to hands   Currently in Pain? No/denies      Objective:  Treatment: 1. PROM right UE wrist, elbow flexion/extension with forearm supination/pronation, shoulder elevation/depression 5 reps each with assistance of therapist, scapular adduction with manual resistance and verbal cuing with patient in sitting position x 10 reps, cervical  spine extension x 10 reps with manual resistance of therapist and verbal cuing, re assessed home exercises for cervical spine and proper positioning to decreased pain /stiffness into right UE/wrist  2. Manual therapy: STM cervical spine into right upper trapezius muscle, cervical spine traction x 10 reps with 5-10 second holds and slow release with patient in sitting position with back supported 3. High volt estim. (4) electrodes Applied to cervical spine upper and mid trapezius muscles continuous mode with moist heat applied to same during treatment x 20 min.  Patient response to treatment: Patient reported decreased pain and stiffness into right wrist and UE following treatment and verbalized understanding of home exercises with return demonstration. Patient requires assistance with all exercises due to severe weakness in both UE's due to chronic muscle condition.         PT Education - 01/31/15 1555    Education provided Yes   Education Details posture control, positioning to decrease strain on shoulders and neck with sitting, wife present for education. discussed exercises he can begin to try in addition to cervical spine and scapular adduction: riding bike, LE exercises.    Person(s) Educated Patient   Methods Explanation;Demonstration;Verbal cues   Comprehension Verbalized understanding;Returned demonstration;Verbal cues required             PT Long Term Goals - 01/11/15 1601    PT LONG TERM GOAL #1   Title pt. will  improve quickDASH to 50% to demonstrate improvement with self perceived disability with ADL's   Baseline 72.5% initially 12/27/2014   Time 6   Period Weeks   Status New   PT LONG TERM GOAL #2   Title pt. will report decreased pain level to max 4-5/10 with aggravating activities   Baseline pain scale 8/10 and constant 12/27/2014   Time 6   Period Weeks   Status New   PT LONG TERM GOAL #3   Title Patient will improve posture awareness with decreased forward head  posture to mild and no soreness into left and right UE   Time 6   Period Weeks   Status New   PT LONG TERM GOAL #4   Title Patient will be independent with home exercise program for prevention/ self-management of neck pain symptoms/exercise    Time 6   Period Weeks   Status New               Plan - 01/31/15 1630    Clinical Impression Statement Patient demonstrates improving symptoms in right UE and hand with decreased stiffness and now able to write with improved fluency. He continues with pain in right upper trapezius muscle and pain into right hand that is alleviated with physical therapy treatment. He will benefit from continued treatment for pain/symptom control and progress exercises to allow transition to independent home program.    Pt will benefit from skilled therapeutic intervention in order to improve on the following deficits Postural dysfunction;Impaired UE functional use;Increased muscle spasms;Decreased strength   Rehab Potential Fair   Clinical Impairments Affecting Rehab Potential chronic condition, degree of weaknessa and atrophy in shoulders and UE's   PT Frequency 1x / week   PT Duration 6 weeks   PT Treatment/Interventions Therapeutic exercise;Moist Heat;Electrical Stimulation;Manual techniques;Patient/family education;Ultrasound;Traction;Passive range of motion   PT Next Visit Plan pain control, reduction of spasms with manual therapy and high volt estim. , progress exercises as indicated        Problem List Patient Active Problem List   Diagnosis Date Noted  . Spondylosis, cervical, with myelopathy 11/07/2014  . 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, [place], [address] Phone: [phone]   Fax:  [phone]

## 2015-02-07 ENCOUNTER — Ambulatory Visit: Payer: Medicare Other | Admitting: Physical Therapy

## 2015-02-07 ENCOUNTER — Encounter: Payer: Self-pay | Admitting: Physical Therapy

## 2015-02-07 DIAGNOSIS — M6281 Muscle weakness (generalized): Secondary | ICD-10-CM

## 2015-02-07 DIAGNOSIS — M4802 Spinal stenosis, cervical region: Secondary | ICD-10-CM | POA: Diagnosis not present

## 2015-02-07 DIAGNOSIS — M62838 Other muscle spasm: Secondary | ICD-10-CM

## 2015-02-07 DIAGNOSIS — M542 Cervicalgia: Secondary | ICD-10-CM

## 2015-02-08 NOTE — Therapy (Signed)
[place] [facility] [address] [place], [address] Phone: [phone]   Fax:  [phone]  Physical Therapy Treatment  Patient Details  Name: Scott Crane MRN: [id number] Date of Birth: June 05, 1930 Referring Provider:  Alda Berthold, DO  Encounter Date: 02/07/2015   Patient began physical therapy on 12/27/2014 through 02/07/2015 and has attended 6 of 10 scheduled visits      PT End of Session - 02/07/15 1615    Visit Number 6   Number of Visits 6   Date for PT Re-Evaluation 02/07/15   Authorization Type 6   Authorization Time Period 10   PT Start Time 1515   PT Stop Time 1605   PT Time Calculation (min) 50 min   Activity Tolerance Patient tolerated treatment well   Behavior During Therapy [facility] for tasks assessed/performed      Past Medical History  Diagnosis Date  . Chickenpox   . Diabetes   . Meniere's disease     Past Surgical History  Procedure Laterality Date  . Cataract extraction      There were no vitals filed for this visit.  Visit Diagnosis:  Cervicalgia  Muscle spasm  Muscle weakness (generalized)      Subjective Assessment - 02/07/15 1551    Subjective Patient reports he has seen quite a bit of improvement with physical therapy interveniton. He continues with intermittent symptoms into right hand and feels it most with sitting at table to eat.    Patient is accompained by: Family member   Patient Stated Goals to be able to drive better and to decrease pain in both UE's to hands   Currently in Pain? No/denies     Objective: Outcome measure: QuickDash: 60% impaired Posture: mild forward head Palpation: mild to no tenderness palpable along right upper trapezius as compared to initial assessment       [facility] Adult PT Treatment/Exercise - 02/07/15 1552    Exercises   Exercises Other Exercises   Other Exercises  isomectric cervical spine extension 5 second holds x 5 reps  with patient in sitting, manual resistive exercises for scapular adduction in sitting x 10 reps with 5 second holds Answered all questions regarding home program and posture modification to alleviate right UE symptoms with sitting/eating activities   Modalities   Modalities Electrical Stimulation high volt;Moist Heat   Moist Heat Therapy   Number Minutes Moist Heat 15 Minutes   Moist Heat Location Cervical region/upper trapezius muscles   Electrical Stimulation   Electrical Stimulation Action cervical spine/upper trapezius muscles with (4) electrodes place bilaterally along upper trapezius and medial border of both scapulae   Electrical Stimulation Parameters intensity to tolerance, muslce spasms setting with continuous mode   Electrical Stimulation Goals --  reduction of muscle spasms and radiating symptoms into right UE and left UE   Manual Therapy   Manual Therapy Soft tissue mobilization;Manual Traction: with patient seated in chair: manual cervical traction performed with mild to moderate distraction with 5 second holds/release x 10 reps, STM performed along right upper trapezius muscle spasm      Patient response to treatment: Patient and spouse verbalized understanding of self management of home program and modification of posture for sitting/eating. Patient reported decreased spasms and pain to mild in right UE/upper trapezius with heat and high volt estim.           PT Education - 02/07/15 1603    Education provided Yes: Reassessed home exercise  and self management of symptoms with sitting postures Patient and spouse verbalized understanding             PT Long Term Goals - March 07, 2015 1938    PT LONG TERM GOAL #1   Title pt. will improve quickDASH to 50% to demonstrate improvement with self perceived disability with ADL's   Baseline 72.5% initially 12/27/2014   Status Partially Met   PT LONG TERM GOAL #2   Title pt. will report decreased pain level to max 4-5/10 with  aggravating activities   Baseline pain scale 8/10 and constant 12/27/2014   Status Achieved   PT LONG TERM GOAL #3   Title Patient will improve posture awareness with decreased forward head posture to mild and no soreness into left and right UE   Status Achieved   PT LONG TERM GOAL #4   Title Patient will be independent with home exercise program for prevention/ self-management of neck pain symptoms/exercise    Status Achieved               Plan - 03/07/15 2015    Clinical Impression Statement Patient has achieved improved symptoms into right UE and left UE and is independent with home program for pain control and posture to help alleviate symptoms and be able modify positions to decrease numbness into right hand. Goals have been achieved and he is ready for discharge from physical therapy at this time and will continue with self management at home with assistance of spouse.    Rehab Potential Fair   Clinical Impairments Affecting Rehab Potential chronic condition, degree of weaknessa and atrophy in shoulders and UE's   PT Frequency 1x / week   PT Duration 6 weeks   PT Treatment/Interventions Therapeutic exercise;Moist Heat;Electrical Stimulation;Manual techniques;Patient/family education;Ultrasound;Traction;Passive range of motion          G-Codes - 2015-03-07 1930    Functional Assessment Tool Used QuickDash, pain scale, clinical judjment   Functional Limitation Carrying, moving and handling objects   Carrying, Moving and Handling Objects Current Status [phone]) At least 60 percent but less than 80 percent impaired, limited or restricted   Carrying, Moving and Handling Objects Goal Status ([id number]) At least 40 percent but less than 60 percent impaired, limited or restricted   Carrying, Moving and Handling Objects Discharge Status [phone]) At least 40 percent but less than 60 percent impaired, limited or restricted      Problem List Patient Active Problem List   Diagnosis Date  Noted  . Spondylosis, cervical, with myelopathy 11/07/2014  . 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, [place], [address] Phone: [phone]   Fax:  [phone]

## 2015-02-14 ENCOUNTER — Encounter: Payer: Self-pay | Admitting: Physical Therapy

## 2015-03-18 DIAGNOSIS — E119 Type 2 diabetes mellitus without complications: Secondary | ICD-10-CM | POA: Insufficient documentation

## 2015-03-18 DIAGNOSIS — Z8619 Personal history of other infectious and parasitic diseases: Secondary | ICD-10-CM | POA: Insufficient documentation

## 2015-03-28 ENCOUNTER — Ambulatory Visit: Payer: Self-pay | Admitting: Family Medicine

## 2015-04-27 IMAGING — CR DG LUMBAR SPINE COMPLETE 4+V
1 series · 5 of 5 positions shown · non-contrast
Comparison: None.

CLINICAL DATA: Chronic pain.

EXAM:
LUMBAR SPINE - COMPLETE 4+ VIEW

[Series 1: kdxr lumbar spine with obliques · 0.14mm/px · 5 of 5 slices shown]
[im 1/5]
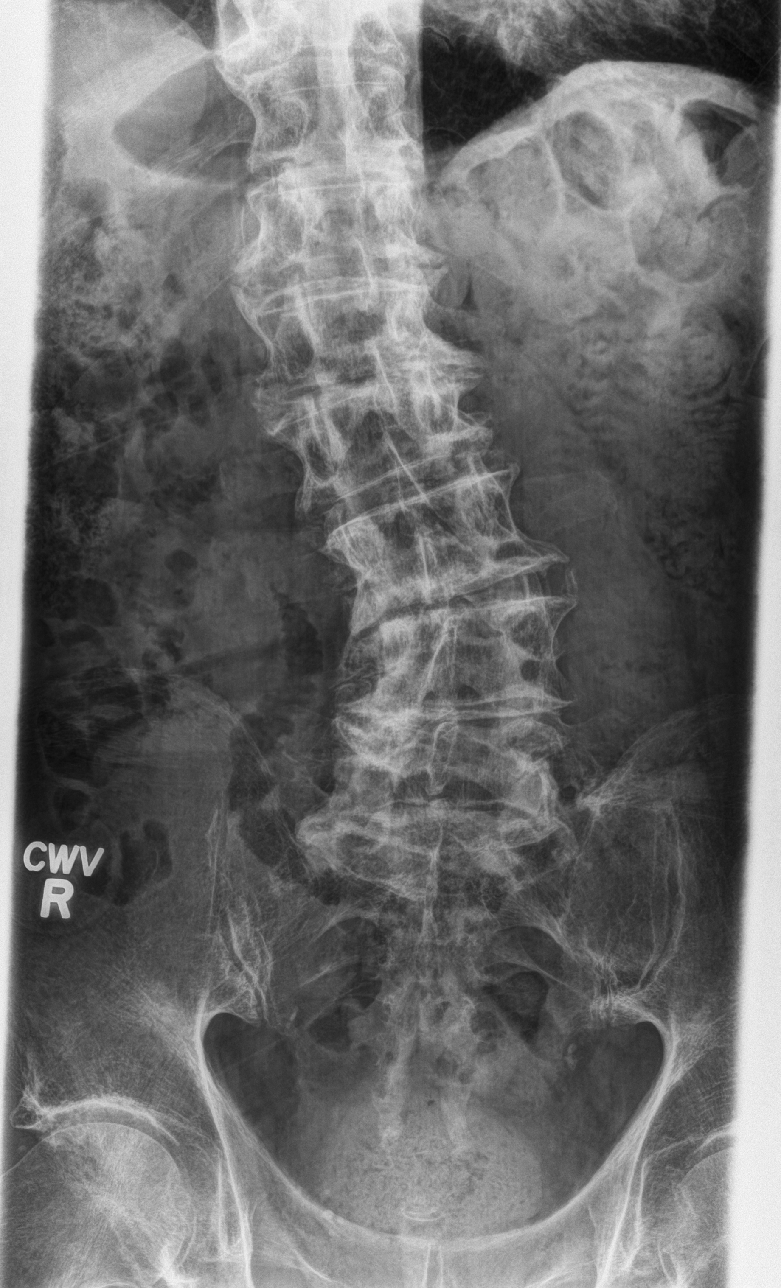
[im 2/5]
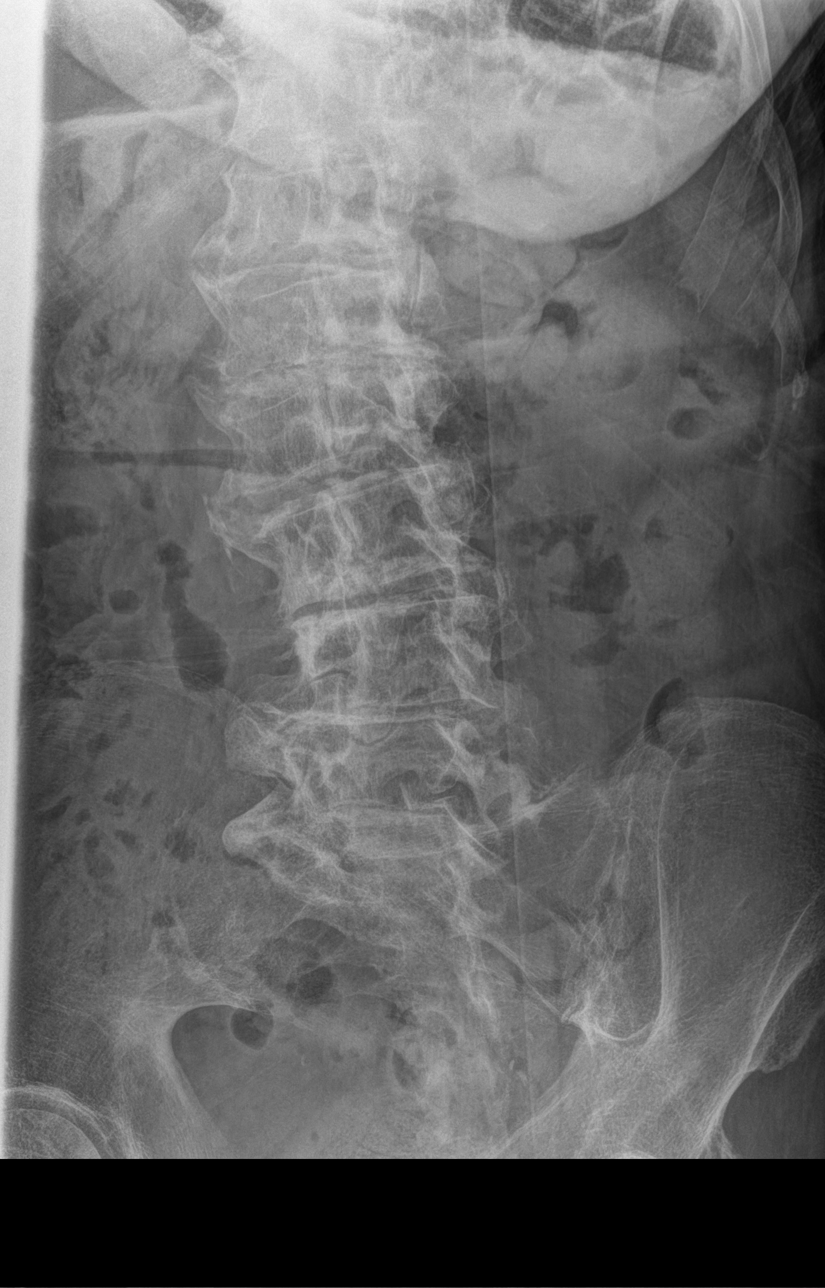
[im 3/5]
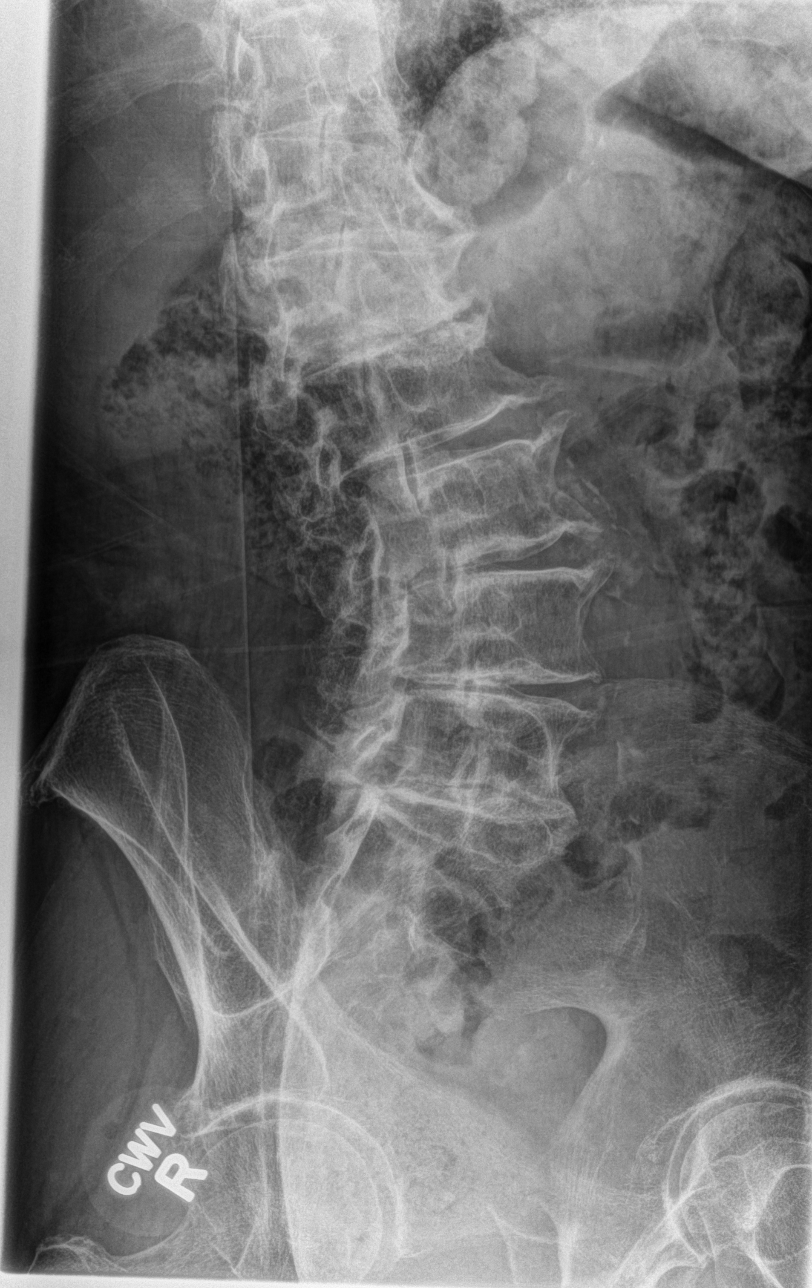
[im 4/5]
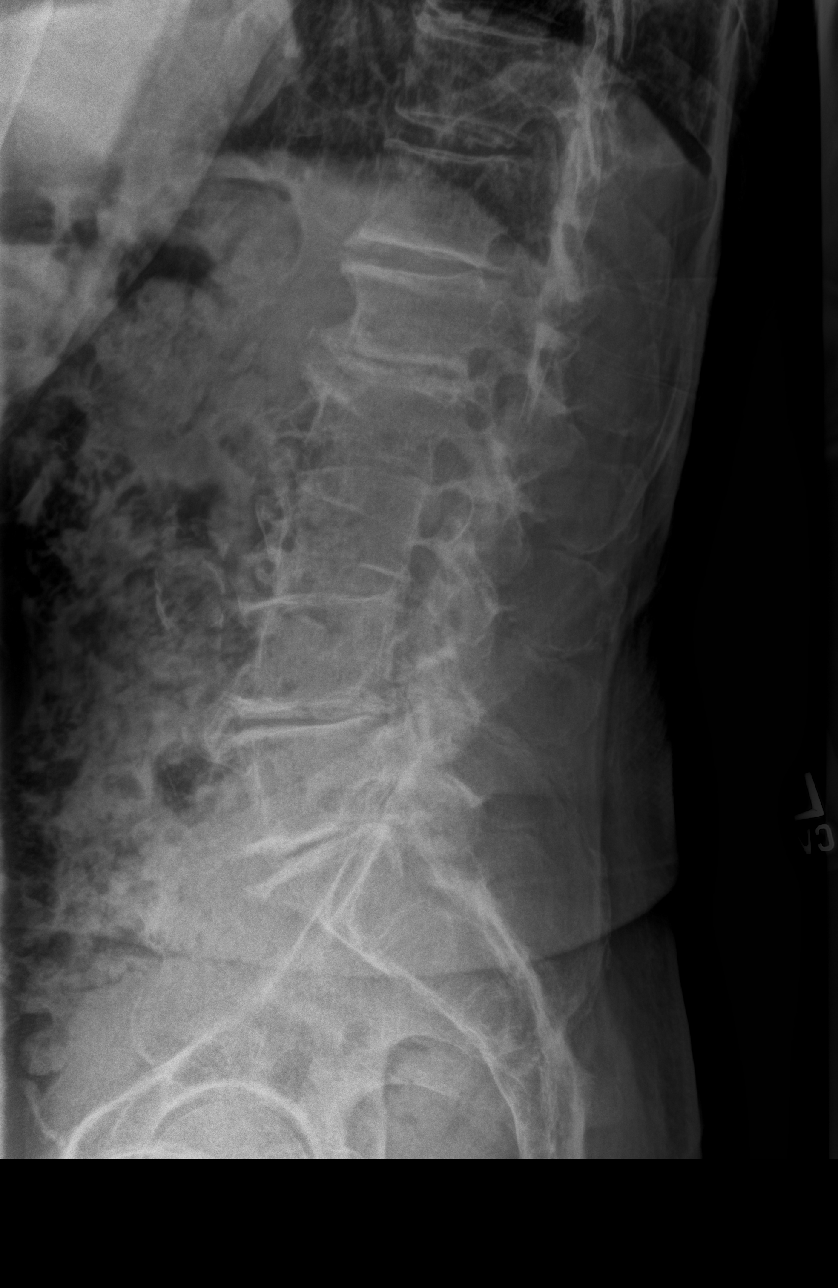
[im 5/5]
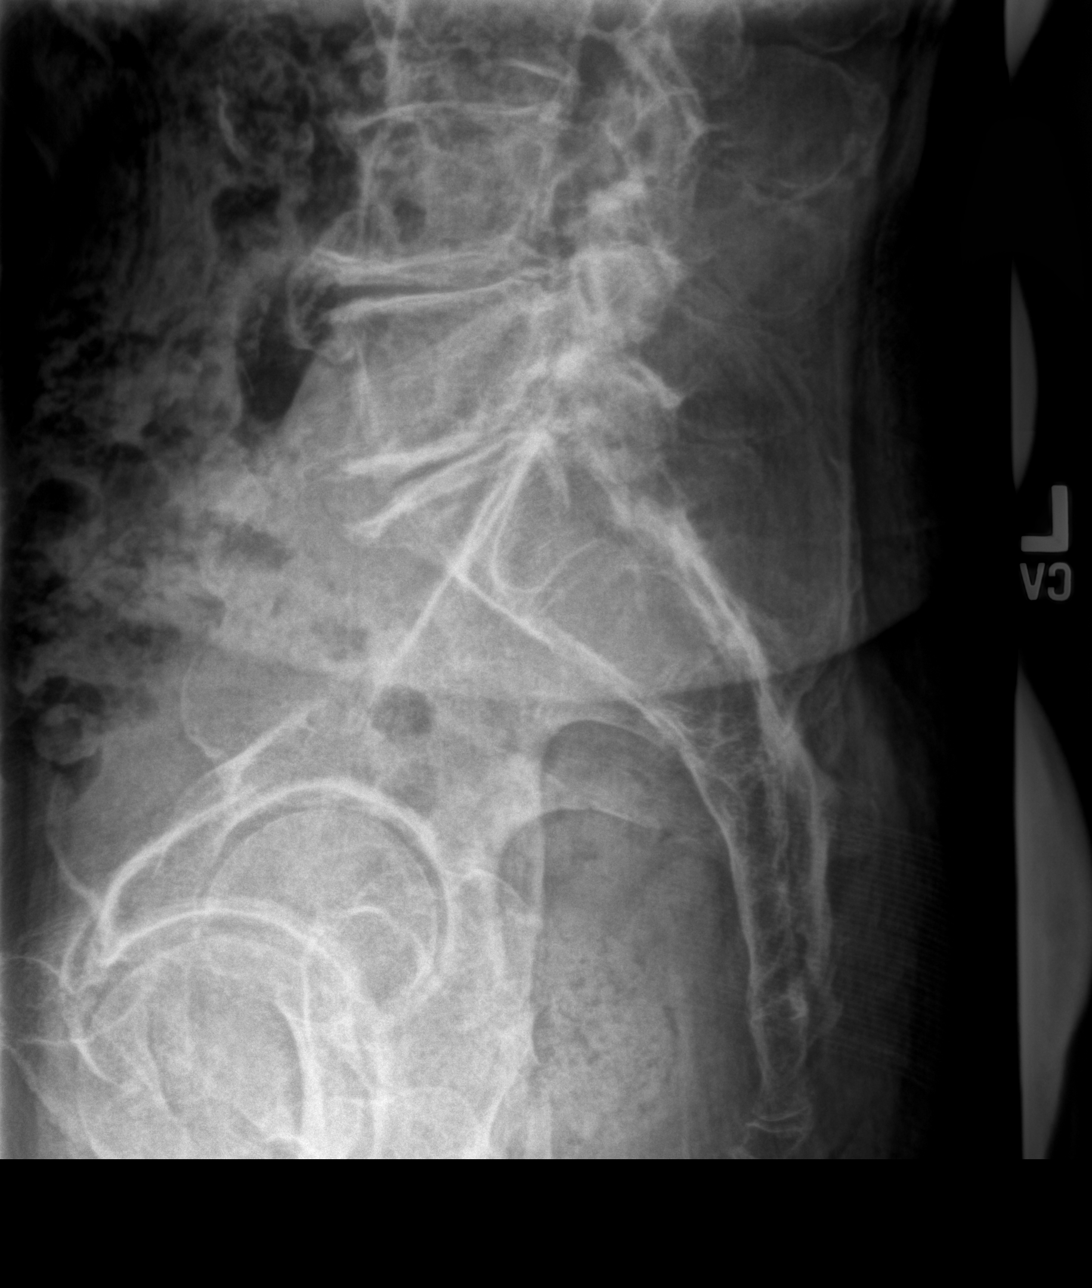

[5 of 5 positions shown; findings below may reference images not displayed]

FINDINGS: Prominent thoracolumbar spine scoliosis and diffuse degenerative
change. Diffuse osteopenia. Multilevel degenerative change present.
No acute bony abnormality identified. Aortoiliac atherosclerotic
vascular disease.
IMPRESSION: Diffuse severe lumbar spine scoliosis and degenerative change. No
acute bony abnormality. Aortoiliac atherosclerotic vascular disease.

## 2015-05-04 ENCOUNTER — Other Ambulatory Visit: Payer: Self-pay | Admitting: Family Medicine

## 2015-05-17 ENCOUNTER — Encounter: Payer: Self-pay | Admitting: Family Medicine

## 2015-06-27 ENCOUNTER — Ambulatory Visit (INDEPENDENT_AMBULATORY_CARE_PROVIDER_SITE_OTHER): Payer: Medicare Other | Admitting: Family Medicine

## 2015-06-27 VITALS — BP 120/62 | HR 84 | Temp 97.6°F | Resp 16 | Wt 137.0 lb

## 2015-06-27 DIAGNOSIS — R739 Hyperglycemia, unspecified: Secondary | ICD-10-CM | POA: Diagnosis not present

## 2015-06-27 DIAGNOSIS — Z23 Encounter for immunization: Secondary | ICD-10-CM | POA: Diagnosis not present

## 2015-06-27 DIAGNOSIS — M62838 Other muscle spasm: Secondary | ICD-10-CM | POA: Diagnosis not present

## 2015-06-27 DIAGNOSIS — G629 Polyneuropathy, unspecified: Secondary | ICD-10-CM

## 2015-06-27 DIAGNOSIS — M159 Polyosteoarthritis, unspecified: Secondary | ICD-10-CM

## 2015-06-27 MED ORDER — CYCLOBENZAPRINE HCL 10 MG PO TABS: 10.0000 mg | ORAL_TABLET | Freq: Three times a day (TID) | ORAL | Status: DC | PRN

## 2015-06-27 NOTE — Progress Notes (Signed)
Patient ID: Scott Crane, male   DOB: May 20, 1930, 81 y.o.   MRN: [id number]   Scott Crane  MRN: [id number] DOB: [birth year]  Subjective:  HPI   1. Hyperglycemia Patient is an 81 year old male who presents for follow up of his hyperglycemia.  He was last seen on 01/21/15.  No management changes were made at that time.  His last A1C was 01/17/15 and it was 6.0.  He does not check his sugar at home.   2. Osteoarthritis of multiple joints, unspecified osteoarthritis type Patient is also here for follow up of his osteoarthritis.  He complains of increased stiffness in his neck, arms and hands.  His hip pain is still present but not requiring him to take the pain medication any more.   He feels he is now having his hips getting weaker--back and hips feel uneven to pt.   Patient Active Problem List   Diagnosis Date Noted  . Diabetes mellitus, type 2 (HCC) 03/18/2015  . H/O varicella 03/18/2015  . Spondylosis, cervical, with myelopathy 11/07/2014  . 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 Atrophy, facioscapulohumeral (HCC) 06/29/2014    Past Medical History  Diagnosis Date  . Chickenpox   . Diabetes   . Meniere's disease     Social History   Social History  . Marital Status: Married    Spouse Name: N/A  . Number of Children: N/A  . Years of Education: N/A   Occupational History  . Not on file.   Social History Main Topics  . Smoking status: Former Smoker -- 5 years    Types: Cigarettes  . Smokeless tobacco: Never Used  . Alcohol Use: No  . Drug Use: No  . Sexual Activity: Not on file   Other Topics Concern  . Not on file   Social History Narrative   Lives with wife in one story home.  Retired from Production manager.  Education: some Designer, fashion/clothing.    Outpatient Prescriptions Prior to Visit  Medication Sig Dispense Refill  . acetaminophen (RA ACETAMINOPHEN) 650 MG CR tablet Take by mouth.    Tery Sanfilippo Calcium (STOOL  SOFTENER PO) Take 1 tablet by mouth daily as needed.    . magnesium oxide (MAG-OX) 400 (241.3 MG) MG tablet take 1 tablet by mouth twice a day 60 tablet 5  . magnesium oxide (MAG-OX) 400 MG tablet Take by mouth.    . meclizine (ANTIVERT) 25 MG tablet Take by mouth.    . meloxicam (MOBIC) 7.5 MG tablet Take by mouth.    . mometasone (ELOCON) 0.1 % cream Apply 1 application topically daily.    . Probiotic Product (PROBIOTIC DAILY PO) Take by mouth.    . gabapentin (NEURONTIN) 100 MG capsule Take 1 capsule (100 mg total) by mouth 3 (three) times daily. (Patient not taking: Reported on 06/27/2015) 90 capsule 5  . Glucosamine-Chondroit-Vit C-Mn (GLUCOSAMINE-CHONDROITIN) CAPS Take by mouth.    . traMADol (ULTRAM) 50 MG tablet Take by mouth.    . celecoxib (CELEBREX) 200 MG capsule Take by mouth.    . diazepam (VALIUM) 5 MG tablet Take one tablet 30-min prior to MRI. (Patient not taking: Reported on 01/11/2015) 2 tablet 0   No facility-administered medications prior to visit.    No Known Allergies  Review of Systems  Constitutional: Positive for malaise/fatigue. Negative for fever, chills, weight loss and diaphoresis.  Respiratory: Negative.   Cardiovascular: Negative.   Gastrointestinal: Negative.   Musculoskeletal: Positive  for joint pain (hip) and neck pain. Negative for myalgias and back pain.  Neurological: Positive for tingling (Patient states it is to the point where he is unable to tell hot from cold and some times he can not tell if he has a hold of something.) and focal weakness. Negative for weakness and headaches.  Psychiatric/Behavioral: Negative.    Objective:  BP 120/62 mmHg  Pulse 84  Temp(Src) 97.6 F (36.4 C) (Oral)  Resp 16  Wt 137 lb (62.143 kg)  Physical Exam  Constitutional: He is oriented to person, place, and time and well-developed, well-nourished, and in no distress.  HENT:  Head: Normocephalic and atraumatic.  Right Ear: External ear normal.  Left Ear:  External ear normal.  Nose: Nose normal.  Eyes: Conjunctivae are normal.  Neck: Neck supple.  Cardiovascular: Normal rate, regular rhythm and normal heart sounds.   Pulmonary/Chest: Effort normal and breath sounds normal.  Abdominal: Soft.  Neurological: He is alert and oriented to person, place, and time.  Patient has almost no use of his right arm or hand. As 2+ strength of the right hand 2+ strength of the left hand up to the left forearm. 2+ of the left biceps this is getting weaker.  Skin: Skin is warm and dry.  Psychiatric: Mood, memory, affect and judgment normal.    Assessment and Plan :  Hyperglycemia  Osteoarthritis of multiple joints, unspecified osteoarthritis type  This is starting to bother pt more. Fasciculohumoral muscular dystrophy Progressive problem for pt.Could this be starting to affect pelvic girdle is possible new issue. More than 50% of visit spent in counselling.  I have done the exam and reviewed the above chart and it is accurate to the best of my knowledge.   Julieanne Manson MD [facility] 06/27/2015 11:30 AM

## 2015-06-28 LAB — HEMOGLOBIN A1C
ESTIMATED AVERAGE GLUCOSE: 114 mg/dL
Hgb A1c MFr Bld: 5.6 % (ref 4.8–5.6)

## 2015-06-28 LAB — VITAMIN B12: VITAMIN B 12: 375 pg/mL (ref 211–946)

## 2015-06-28 LAB — TSH: TSH: 1.2 u[IU]/mL (ref 0.450–4.500)

## 2015-07-01 ENCOUNTER — Encounter: Payer: Self-pay | Admitting: Family Medicine

## 2015-08-02 ENCOUNTER — Ambulatory Visit (INDEPENDENT_AMBULATORY_CARE_PROVIDER_SITE_OTHER): Payer: Medicare Other | Admitting: Family Medicine

## 2015-08-02 VITALS — BP 134/82 | HR 80 | Temp 97.8°F | Resp 16 | Wt 133.0 lb

## 2015-08-02 DIAGNOSIS — G8929 Other chronic pain: Secondary | ICD-10-CM

## 2015-08-02 DIAGNOSIS — M159 Polyosteoarthritis, unspecified: Secondary | ICD-10-CM

## 2015-08-02 DIAGNOSIS — G710 Muscular dystrophy: Secondary | ICD-10-CM

## 2015-08-02 DIAGNOSIS — G7102 Facioscapulohumeral muscular dystrophy: Secondary | ICD-10-CM

## 2015-08-02 NOTE — Progress Notes (Signed)
Patient ID: Scott Crane, male   DOB: 1930/05/15, 81 y.o.   MRN: [id number]   Scott Crane  MRN: [id number] DOB: [birth year]  Subjective:  HPI   1. Chronic pain Patient is a 81 year old male who presents for follow up of his chronic pain.  Patient was not able to take the muscle relaxer the way it was prescribed due to the insurance only paying for 30 pills.  Patient and his granddaughter are interested in seeing [facility].  Patient Active Problem List   Diagnosis Date Noted  . Diabetes mellitus, type 2 (HCC) 03/18/2015  . H/O varicella 03/18/2015  . Spondylosis, cervical, with myelopathy 11/07/2014  . 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 Atrophy, facioscapulohumeral (HCC) 06/29/2014    Past Medical History  Diagnosis Date  . Chickenpox   . Diabetes (HCC)   . Meniere's disease     Social History   Social History  . Marital Status: Married    Spouse Name: N/A  . Number of Children: N/A  . Years of Education: N/A   Occupational History  . Not on file.   Social History Main Topics  . Smoking status: Former Smoker -- 5 years    Types: Cigarettes  . Smokeless tobacco: Never Used  . Alcohol Use: No  . Drug Use: No  . Sexual Activity: Not on file   Other Topics Concern  . Not on file   Social History Narrative   Lives with wife in one story home.  Retired from Production manager.  Education: some Designer, fashion/clothing.    Outpatient Prescriptions Prior to Visit  Medication Sig Dispense Refill  . acetaminophen (RA ACETAMINOPHEN) 650 MG CR tablet Take by mouth.    Tery Sanfilippo Calcium (STOOL SOFTENER PO) Take 1 tablet by mouth daily as needed.    . gabapentin (NEURONTIN) 100 MG capsule Take 1 capsule (100 mg total) by mouth 3 (three) times daily. 90 capsule 5  . Glucosamine-Chondroit-Vit C-Mn (GLUCOSAMINE-CHONDROITIN) CAPS Take by mouth.    . magnesium oxide (MAG-OX) 400 (241.3 MG) MG tablet take 1 tablet  by mouth twice a day 60 tablet 5  . magnesium oxide (MAG-OX) 400 MG tablet Take by mouth.    . meclizine (ANTIVERT) 25 MG tablet Take by mouth.    . meloxicam (MOBIC) 7.5 MG tablet Take by mouth.    . mometasone (ELOCON) 0.1 % cream Apply 1 application topically daily.    . Probiotic Product (PROBIOTIC DAILY PO) Take by mouth.    . traMADol (ULTRAM) 50 MG tablet Take by mouth.    . cyclobenzaprine (FLEXERIL) 10 MG tablet Take 1 tablet (10 mg total) by mouth 3 (three) times daily as needed for muscle spasms. (Patient not taking: Reported on 08/02/2015) 30 tablet 0   No facility-administered medications prior to visit.    No Known Allergies  Review of Systems  Constitutional: Negative for fever, chills and diaphoresis.  Eyes: Negative.   Respiratory: Negative for cough, shortness of breath and wheezing.   Cardiovascular: Negative for chest pain, palpitations and orthopnea.  Gastrointestinal: Negative.   Musculoskeletal: Positive for back pain, joint pain and neck pain.  Neurological: Positive for weakness.  Endo/Heme/Allergies: Negative.   Psychiatric/Behavioral: Negative.    Objective:  BP 134/82 mmHg  Pulse 80  Temp(Src) 97.8 F (36.6 C) (Oral)  Resp 16  Wt 133 lb (60.328 kg)  Physical Exam  Constitutional: He is oriented to person, place, and time and well-developed,  well-nourished, and in no distress.  HENT:  Head: Normocephalic and atraumatic.  Right Ear: External ear normal.  Left Ear: External ear normal.  Nose: Nose normal.  Eyes: Conjunctivae are normal.  Neck: Neck supple.  Cardiovascular: Normal rate, regular rhythm and normal heart sounds.   Pulmonary/Chest: Effort normal and breath sounds normal.  Abdominal: Soft. Bowel sounds are normal.  Musculoskeletal:  Patient's trunk leans to the right and due to his lean physique he feels like his bone sticks out at hip.  Neurological: He is alert and oriented to person, place, and time.  Skin: Skin is warm and dry.    Psychiatric: Mood, memory, affect and judgment normal.    Assessment and Plan :   1. Chronic pain   2. Osteoarthritis of multiple joints, unspecified osteoarthritis type   3. Fascioscapulohumeral muscular dystrophy (HCC) - Ambulatory referral to Neurosurgery 4.Cervical radiculopathy Unlikely but possible. Will need neurologic consultation. More than 50% of time spent in counselling. I have done the exam and reviewed the above chart and it is accurate to the best of my knowledge.  Julieanne Manson MD [facility] 08/02/2015 4:24 PM

## 2015-08-07 ENCOUNTER — Telehealth: Payer: Self-pay | Admitting: Family Medicine

## 2015-08-07 NOTE — Telephone Encounter (Signed)
Please review. Thanks!  

## 2015-08-07 NOTE — Telephone Encounter (Signed)
Scott Crane from Dr Lonie Peak office called stating that they do not treat fascioscapulohumeral muscular dystrophy.She that for this diagnosis pt should be seen by a neurologist.Is the the correct diagnosis ?

## 2015-08-09 NOTE — Telephone Encounter (Signed)
Shoule f/u with [facility] neurology

## 2015-08-09 NOTE — Telephone Encounter (Signed)
Advised  ED 

## 2015-08-22 ENCOUNTER — Telehealth: Payer: Self-pay | Admitting: Family Medicine

## 2015-08-22 NOTE — Telephone Encounter (Signed)
Advised Scott Crane that patient was up to date on all pneumonia vaccines.

## 2015-08-22 NOTE — Telephone Encounter (Signed)
Vickie called asking if Scott Crane is up to date on his pneumonia vaccine.  She said there has been a lot of illness in their family.  Her call back is   [phone]  Thanks Barth Kirks

## 2015-08-30 ENCOUNTER — Ambulatory Visit (INDEPENDENT_AMBULATORY_CARE_PROVIDER_SITE_OTHER): Payer: Medicare Other | Admitting: Neurology

## 2015-08-30 ENCOUNTER — Encounter: Payer: Self-pay | Admitting: Neurology

## 2015-08-30 VITALS — BP 135/76 | HR 78 | Ht 68.0 in | Wt 130.0 lb

## 2015-08-30 DIAGNOSIS — M4712 Other spondylosis with myelopathy, cervical region: Secondary | ICD-10-CM

## 2015-08-30 MED ORDER — DULOXETINE HCL 30 MG PO CPEP: 30.0000 mg | ORAL_CAPSULE | Freq: Every day | ORAL | Status: DC

## 2015-08-30 NOTE — Patient Instructions (Signed)
   We will get a MRI of the cervical spine.

## 2015-08-30 NOTE — Progress Notes (Signed)
Reason for visit: Arm weakness and numbness  Referring physician: Dr. Holley Raring is a 81 y.o. male  History of present illness:  Scott Crane is an 81 year old right-handed white male with a history of issues with right greater than left upper extremity weakness since the 1990s. The patient has had gradual onset of weakness that has affected both arms, primarily in the C5 and C6 innervated muscles. The patient fell one year ago and he has noted some acceleration of weakness in the left and right arms, and onset of numbness that began in the C6 dermatome distribution on the right hand, and then gradually spread to both arms. The patient has some discomfort in both arms, he does have neck pain. He does not note any shock sensations down the arms or legs with neck flexion or extension. He denies any significant issues controlling the bladder, he has noted some mild fecal incontinence at times, however. He does have a mild gait disorder, he has not had any further falls. He denies any numbness or weakness of the extremities. He has undergone MRI evaluation of the cervical spine done 1 year ago showing compression of the spinal cord at the C1-2 level, with myelopathic changes in the cord from the C2 through the C6 levels. The patient is sent to this office for further evaluation.  Past Medical History  Diagnosis Date  . Chickenpox   . Diabetes (HCC)   . Meniere's disease     Past Surgical History  Procedure Laterality Date  . Cataract extraction    . Muscle biopsy      Family History  Problem Relation Age of Onset  . Stroke Mother   . Arthritis Sister     Social history:  reports that he has quit smoking. His smoking use included Cigarettes. He quit after 5 years of use. He has never used smokeless tobacco. He reports that he does not drink alcohol or use illicit drugs.  Medications:  Prior to Admission medications   Medication Sig Start Date End Date Taking?  Authorizing Provider  acetaminophen (RA ACETAMINOPHEN) 650 MG CR tablet Take by mouth.   Yes Historical Provider, MD  Docusate Calcium (STOOL SOFTENER PO) Take 1 tablet by mouth daily as needed.   Yes Historical Provider, MD  magnesium oxide (MAG-OX) 400 (241.3 MG) MG tablet take 1 tablet by mouth twice a day 05/05/15  Yes Richard Hulen Shouts., MD  magnesium oxide (MAG-OX) 400 MG tablet Take by mouth.   Yes Historical Provider, MD  meclizine (ANTIVERT) 25 MG tablet Take by mouth.   Yes Historical Provider, MD  mometasone (ELOCON) 0.1 % cream Apply 1 application topically daily.   Yes Historical Provider, MD  Multiple Vitamins-Minerals (PRESERVISION AREDS PO) Take 1 capsule by mouth 2 (two) times daily.   Yes Historical Provider, MD  Probiotic Product (PROBIOTIC DAILY PO) Take by mouth.   Yes Historical Provider, MD     No Known Allergies  ROS:  Out of a complete 14 system review of symptoms, the patient complains only of the following symptoms, and all other reviewed systems are negative.  Joint pain, muscle cramps Numbness, weakness  Blood pressure 135/76, pulse 78, height  (1.727 m), weight 130 lb (58.968 kg).  Physical Exam  General: The patient is alert and cooperative at the time of the examination.  Eyes: Pupils are equal, round, and reactive to light. Discs are flat bilaterally.  Neck: The neck is supple, no carotid  bruits are noted.  Respiratory: The respiratory examination is clear.  Cardiovascular: The cardiovascular examination reveals a regular rate and rhythm, no obvious murmurs or rubs are noted.  Skin: Extremities are without significant edema.  Neurologic Exam  Mental status: The patient is alert and oriented x 3 at the time of the examination. The patient has apparent normal recent and remote memory, with an apparently normal attention span and concentration ability.  Cranial nerves: Facial symmetry is present. There is good sensation of the face to  pinprick and soft touch bilaterally. The strength of the facial muscles and the muscles to head turning and shoulder shrug are normal bilaterally. Speech is well enunciated, no aphasia or dysarthria is noted. Extraocular movements are full. Visual fields are full. The tongue is midline, and the patient has symmetric elevation of the soft palate. No obvious hearing deficits are noted.  Motor: The motor testing reveals 5 over 5 strength of all 4 extremities, with exception that there is found weakness, 2/5 involving the biceps, supinators, deltoids, and with external rotation the shoulder bilaterally. Good symmetric motor tone is noted throughout.  Sensory: Sensory testing is intact to pinprick, soft touch, vibration sensation, and position sense on all 4 extremities, but the patient subjectively notes decreased pinprick sensation on both arms. No evidence of extinction is noted.  Coordination: Cerebellar testing reveals good heel-to-shin bilaterally. The patient has difficulty performing finger-nose-finger bilaterally.  Gait and station: Gait is normal. Tandem gait is slightly unsteady. Romberg is negative. No drift is seen.  Reflexes: Deep tendon reflexes are symmetric and normal bilaterally, with exception of decrease in the biceps reflexes bilaterally. Toes are equivocal bilaterally.   Assessment/Plan:  1. Cervical spondylosis, cervical myelopathy  2. 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 He has sensory alterations in both arms. He has had progression of symptoms over the last one year. We will set him up for repeat MRI the cervical spine using a 3 T unit, and consider a referral to neurosurgery once the study is done. The patient will be placed on Cymbalta for the discomfort. He has been tried on gabapentin previously without much benefit.  Marlan Palau  MD 08/30/2015 6:34 PM  [facility] [address] [place], [place] [address]  Phone [phone] Fax [phone]

## 2015-09-05 ENCOUNTER — Ambulatory Visit
Admission: RE | Admit: 2015-09-05 | Discharge: 2015-09-05 | Disposition: A | Discharge status: Home / Self Care | Payer: Medicare Other | Source: Ambulatory Visit | Attending: Neurology | Admitting: Neurology

## 2015-09-05 DIAGNOSIS — M4712 Other spondylosis with myelopathy, cervical region: Secondary | ICD-10-CM

## 2015-09-07 ENCOUNTER — Telehealth: Payer: Self-pay | Admitting: Neurology

## 2015-09-07 DIAGNOSIS — M4712 Other spondylosis with myelopathy, cervical region: Secondary | ICD-10-CM

## 2015-09-07 NOTE — Telephone Encounter (Signed)
I called the patient, talk with the wife. The patient has multilevel spinal stenosis, evidence of spinal cord flattening, spinal cord thinning, with changes from the C2 level through the C5-6 level. This likely is the etiology of his "dangling arm syndrome". They are amenable to a neurosurgical opinion, I will get this set up. The patient is progressing over time with the weakness. This likely was accelerated from a fall in early 2016.   MRI cervical spine September 06 2015:  IMPRESSION: This MRI of the cervical spine without contrast shows the following: 1. There is severe spinal stenosis mostly due to hypertrophy of the transverse ligament posterior to the odontoid at C1-C2. This causes spinal cord compression with myelopathic signal extending from C2 through C5-C6. This is essentially unchanged when compared to the 09/05/2014 MRI. 2. At C2-C3 there is right facet fusion not leading to any nerve root impingement. 3. At C3-C4 there is mild spinal stenosis but severe right foraminal narrowing with probable right C4 nerve root compression. This also appears unchanged when compared to the previous MRI. 4. C4-C5 and C5-C6 showed degenerative fusion. The neural foramina are not significantly narrowed at these levels. 5. At C6-C7 there is mild spinal stenosis due to degenerative changes. The neural foramina are only mildly narrowed and there is no nerve root impingement. 6. There are no acute findings. Compared to the MRI dated 09/05/2014, there do not appear to be any significant changes.

## 2015-10-04 ENCOUNTER — Ambulatory Visit (INDEPENDENT_AMBULATORY_CARE_PROVIDER_SITE_OTHER): Payer: Medicare Other | Admitting: Family Medicine

## 2015-10-04 ENCOUNTER — Encounter: Payer: Self-pay | Admitting: Family Medicine

## 2015-10-04 DIAGNOSIS — L821 Other seborrheic keratosis: Secondary | ICD-10-CM

## 2015-10-04 DIAGNOSIS — J3489 Other specified disorders of nose and nasal sinuses: Secondary | ICD-10-CM

## 2015-10-04 DIAGNOSIS — L989 Disorder of the skin and subcutaneous tissue, unspecified: Secondary | ICD-10-CM | POA: Diagnosis not present

## 2015-10-04 DIAGNOSIS — H8109 Meniere's disease, unspecified ear: Secondary | ICD-10-CM

## 2015-10-04 DIAGNOSIS — R42 Dizziness and giddiness: Secondary | ICD-10-CM | POA: Diagnosis not present

## 2015-10-04 DIAGNOSIS — E119 Type 2 diabetes mellitus without complications: Secondary | ICD-10-CM | POA: Diagnosis not present

## 2015-10-04 LAB — POCT GLYCOSYLATED HEMOGLOBIN (HGB A1C): HEMOGLOBIN A1C: 5.6

## 2015-10-04 MED ORDER — IPRATROPIUM BROMIDE 0.03 % NA SOLN: 2.0000 | Freq: Two times a day (BID) | NASAL | Status: DC

## 2015-10-04 MED ORDER — MECLIZINE HCL 25 MG PO TABS: 25.0000 mg | ORAL_TABLET | Freq: Three times a day (TID) | ORAL | Status: DC | PRN

## 2015-10-04 NOTE — Progress Notes (Signed)
Patient ID: Scott Crane, male   DOB: [birth year], 82 y.o.   MRN: [id number]    Subjective:  HPI  Diabetes Mellitus Type II, Follow-up:   Lab Results  Component Value Date   HGBA1C 5.6 06/27/2015   HGBA1C 6.0 01/17/2015    Last seen for diabetes 4 months ago.  Management since then includes none. He reports good compliance with treatment. He is not having side effects.  Current symptoms include none. Home blood sugar records: not being checked.  Episodes of hypoglycemia? no   Current Insulin Regimen: n/a  Pertinent Labs:    Component Value Date/Time   CHOL 149 01/17/2015   TRIG 71 01/17/2015   HDL 56 01/17/2015   LDLCALC 79 01/17/2015   CREATININE 1.0 01/17/2015    Wt Readings from Last 3 Encounters:  08/30/15 130 lb (58.968 kg)  08/02/15 133 lb (60.328 kg)  06/27/15 137 lb (62.143 kg)    ------------------------------------------------------------------------  Skin lesions that he has on his left arm and left ear. He is also having a runny nose all the time, does not feel like it is cold related. He reports that it is more bothersome than anything.  His neurological symptoms are the same. He goes to see the neurosurgeon nest week.   Prior to Admission medications   Medication Sig Start Date End Date Taking? Authorizing Provider  acetaminophen (RA ACETAMINOPHEN) 650 MG CR tablet Take by mouth.   Yes Historical Provider, MD  Docusate Calcium (STOOL SOFTENER PO) Take 1 tablet by mouth daily as needed.   Yes Historical Provider, MD  DULoxetine (CYMBALTA) 30 MG capsule Take 1 capsule (30 mg total) by mouth daily. 08/30/15  Yes York Spaniel, MD  magnesium oxide (MAG-OX) 400 (241.3 MG) MG tablet take 1 tablet by mouth twice a day 05/05/15  Yes Richard Hulen Shouts., MD  magnesium oxide (MAG-OX) 400 MG tablet Take by mouth.   Yes Historical Provider, MD  meclizine (ANTIVERT) 25 MG tablet Take by mouth.   Yes Historical Provider, MD  mometasone (ELOCON) 0.1 %  cream Apply 1 application topically daily.   Yes Historical Provider, MD  Multiple Vitamins-Minerals (PRESERVISION AREDS PO) Take 1 capsule by mouth 2 (two) times daily.   Yes Historical Provider, MD  Probiotic Product (PROBIOTIC DAILY PO) Take by mouth.   Yes Historical Provider, MD    Patient Active Problem List   Diagnosis Date Noted  . Diabetes mellitus, type 2 (HCC) 03/18/2015  . H/O varicella 03/18/2015  . Spondylosis, cervical, with myelopathy 11/07/2014  . 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 Facioscapulohumeral muscular dystrophy (HCC) 06/29/2014    Past Medical History  Diagnosis Date  . Chickenpox   . Diabetes (HCC)   . Meniere's disease     Social History   Social History  . Marital Status: Married    Spouse Name: N/A  . Number of Children: 1  . Years of Education: college   Occupational History  . retired    Social History Main Topics  . Smoking status: Former Smoker -- 5 years    Types: Cigarettes  . Smokeless tobacco: Never Used  . Alcohol Use: No  . Drug Use: No  . Sexual Activity: Not on file   Other Topics Concern  . Not on file   Social History Narrative   Lives with wife in one story home.  Retired from Production manager.  Education: some Designer, fashion/clothing.  Patient drinks 2 cups of caffeine daily.   Patient is right handed.     No Known Allergies  Review of Systems  Constitutional: Negative.   HENT: Negative.        Rhinorrhea   Eyes: Negative.   Respiratory: Negative.   Cardiovascular: Negative.   Gastrointestinal: Negative.   Genitourinary: Negative.   Musculoskeletal: Negative.   Skin: Negative.        Skin lesion on arm and ear.  Neurological: Negative.   Endo/Heme/Allergies: Negative.   Psychiatric/Behavioral: Negative.     Immunization History  Administered Date(s) Administered  . Influenza, High Dose Seasonal PF 06/27/2015  .  Pneumococcal Conjugate-13 03/22/2014  . Pneumococcal Polysaccharide-23 07/06/1998  . Td 03/29/2004  . Tdap 05/08/2011   Objective:  There were no vitals taken for this visit.  Physical Exam  Constitutional: He is oriented to person, place, and time and well-developed, well-nourished, and in no distress.  HENT:  Head: Normocephalic and atraumatic.  Right Ear: External ear normal.  Left Ear: External ear normal.  Nose: Nose normal.  Eyes: Conjunctivae and EOM are normal. Pupils are equal, round, and reactive to light.  Neck: Normal range of motion. Neck supple.  Cardiovascular: Normal rate, regular rhythm, normal heart sounds and intact distal pulses.   Pulmonary/Chest: Effort normal and breath sounds normal.  Abdominal: Soft. Bowel sounds are normal.  Musculoskeletal: Normal range of motion.  Neurological: He is alert and oriented to person, place, and time. He has normal reflexes. Gait normal. GCS score is 15.  Skin: Skin is warm and dry.  seborrheic keratosis of left ear and arm  Psychiatric: Mood, memory, affect and judgment normal.    Lab Results  Component Value Date   WBC 4.0 01/17/2015   HGB 13.3* 01/17/2015   HCT 38* 01/17/2015   PLT 211 01/17/2015   CHOL 149 01/17/2015   TRIG 71 01/17/2015   HDL 56 01/17/2015   LDLCALC 79 01/17/2015   TSH 1.200 06/27/2015   HGBA1C 5.6 06/27/2015    CMP     Component Value Date/Time   NA 141 01/17/2015   K 5.3 01/17/2015   BUN 25* 01/17/2015   CREATININE 1.0 01/17/2015   AST 19 01/17/2015   ALT 13 01/17/2015   ALKPHOS 94 01/17/2015    Assessment and Plan :  1. Type 2 diabetes mellitus without complication, without long-term current use of insulin (HCC)  - POCT HgB A1C-5.6 today. Will follow every 6 months.  2. SK (seborrheic keratosis)   3. Skin lesion  - Ambulatory referral to Dermatology  4. Rhinorrhea  - ipratropium (ATROVENT) 0.03 % nasal spray; Place 2 sprays into both nostrils every 12 (twelve) hours.   Dispense: 30 mL; Refill: 12  5. Dizziness Start PA process on meclizine. - meclizine (ANTIVERT) 25 MG tablet; Take 1 tablet (25 mg total) by mouth 3 (three) times daily as needed for dizziness.  Dispense: 90 tablet; Refill: 12  6.cervical myelopathy versus muscular dystrophy Or than 50% of this visit spent in counseling. I have done the exam and reviewed the above chart and it is accurate to the best of my knowledge.  Patient was seen and examined by Dr. Julieanne Manson, and noted scribed by Dimas Chyle, CMA  Julieanne Manson MD [facility] 10/04/2015 11:33 AM

## 2015-10-04 NOTE — Patient Instructions (Signed)
If the nasal spray that was sent to pharmacy does not work, get over the counter Loratadine (generic Claritin) or Benadryl.

## 2015-10-10 ENCOUNTER — Telehealth: Payer: Self-pay | Admitting: Family Medicine

## 2015-10-10 NOTE — Telephone Encounter (Signed)
PA was for meclizine due to Compro or prochlorperazine was not tried first. It states we can also send documentation stating why he has to have meclizine instead of other options. Please review.-aa

## 2015-10-10 NOTE — Telephone Encounter (Signed)
Pt's daughter [name] called to get an update about pt's medication that a PA was supposed to be worked on. [name] would like a nurse to call her back to update her. I thinks it was denied. Thanks TNP

## 2015-10-11 MED ORDER — PROCHLORPERAZINE MALEATE 10 MG PO TABS: 10.0000 mg | ORAL_TABLET | Freq: Four times a day (QID) | ORAL | Status: DC | PRN

## 2015-10-11 NOTE — Telephone Encounter (Signed)
Daughter advised, RX sent in-aa 

## 2015-10-11 NOTE — Telephone Encounter (Signed)
OK to try perchlorazine

## 2015-10-17 ENCOUNTER — Telehealth: Payer: Self-pay | Admitting: Family Medicine

## 2015-10-17 NOTE — Telephone Encounter (Signed)
Daughter called wanting to know if he has ever had the chicken pox because she is going to get the shingles vaccine and she is taking care of him daily.  She is his care giver and is trying to find home health while she is at work.   Can you recommend someone?   Her call back [phone]

## 2015-10-17 NOTE — Telephone Encounter (Signed)
Advised  ED 

## 2015-10-24 ENCOUNTER — Other Ambulatory Visit: Payer: Self-pay

## 2015-10-24 MED ORDER — MECLIZINE HCL 12.5 MG PO TABS: ORAL_TABLET | ORAL | Status: DC

## 2015-11-15 ENCOUNTER — Telehealth: Payer: Self-pay | Admitting: Family Medicine

## 2015-11-15 DIAGNOSIS — M1990 Unspecified osteoarthritis, unspecified site: Secondary | ICD-10-CM

## 2015-11-15 NOTE — Telephone Encounter (Signed)
Pt's daughter Larene Beach requested a refill on traMADol (ULTRAM) 50 MG tablet. Last written on 01/17/15. She stated pt should have enough until middle of next week but would like it sent to [facility] [address]. She was advised Dr. Sullivan Lone is out of the office until Monday. Thanks TNP

## 2015-11-15 NOTE — Telephone Encounter (Signed)
Please review-aa 

## 2015-11-15 NOTE — Telephone Encounter (Signed)
Don't see on med list. Can add to list, same rx as previous and I can fill. Thanks.

## 2015-11-16 DIAGNOSIS — M1990 Unspecified osteoarthritis, unspecified site: Secondary | ICD-10-CM | POA: Insufficient documentation

## 2015-11-16 MED ORDER — TRAMADOL HCL 50 MG PO TABS: 50.0000 mg | ORAL_TABLET | Freq: Four times a day (QID) | ORAL | Status: DC | PRN

## 2015-11-16 NOTE — Telephone Encounter (Signed)
Prescription printed. Please notify patient it is ready for pick up. Thanks- Dr. Lanea Vankirk.  

## 2015-11-20 DIAGNOSIS — G35 Multiple sclerosis: Secondary | ICD-10-CM | POA: Diagnosis not present

## 2015-11-20 DIAGNOSIS — H8109 Meniere's disease, unspecified ear: Secondary | ICD-10-CM | POA: Diagnosis not present

## 2015-11-29 ENCOUNTER — Telehealth: Payer: Self-pay | Admitting: Family Medicine

## 2015-11-29 DIAGNOSIS — M25551 Pain in right hip: Secondary | ICD-10-CM

## 2015-11-29 DIAGNOSIS — M25552 Pain in left hip: Principal | ICD-10-CM

## 2015-11-29 NOTE — Telephone Encounter (Signed)
Ok to do

## 2015-11-29 NOTE — Telephone Encounter (Signed)
Daughter called saying her dad is having a lot of pain in his hips.  He was taking tramadol but just not helping that much right now.  She's requesting a referral for orthopedic.  He has [facility] Medicare.  Her call back is [phone]  Thanks Barth Kirks

## 2015-11-29 NOTE — Telephone Encounter (Signed)
Is this ok?-aa 

## 2015-11-29 NOTE — Telephone Encounter (Signed)
Left message for daughter to call back. Order placed for referral.

## 2016-02-19 ENCOUNTER — Other Ambulatory Visit: Payer: Self-pay | Admitting: Family Medicine

## 2016-04-02 ENCOUNTER — Ambulatory Visit: Payer: Medicare Other | Admitting: Family Medicine

## 2016-04-04 ENCOUNTER — Encounter: Payer: Self-pay | Admitting: Family Medicine

## 2016-04-04 ENCOUNTER — Ambulatory Visit (INDEPENDENT_AMBULATORY_CARE_PROVIDER_SITE_OTHER): Payer: Medicare Other | Admitting: Family Medicine

## 2016-04-04 VITALS — BP 122/64 | HR 78 | Temp 97.9°F | Resp 14 | Wt 128.0 lb

## 2016-04-04 DIAGNOSIS — H8109 Meniere's disease, unspecified ear: Secondary | ICD-10-CM

## 2016-04-04 DIAGNOSIS — G8929 Other chronic pain: Secondary | ICD-10-CM | POA: Diagnosis not present

## 2016-04-04 DIAGNOSIS — G629 Polyneuropathy, unspecified: Secondary | ICD-10-CM | POA: Diagnosis not present

## 2016-04-04 DIAGNOSIS — R42 Dizziness and giddiness: Secondary | ICD-10-CM

## 2016-04-04 DIAGNOSIS — G710 Muscular dystrophy: Secondary | ICD-10-CM

## 2016-04-04 DIAGNOSIS — R5382 Chronic fatigue, unspecified: Secondary | ICD-10-CM

## 2016-04-04 DIAGNOSIS — R739 Hyperglycemia, unspecified: Secondary | ICD-10-CM | POA: Diagnosis not present

## 2016-04-04 DIAGNOSIS — G7102 Facioscapulohumeral muscular dystrophy: Secondary | ICD-10-CM

## 2016-04-04 MED ORDER — MECLIZINE HCL 25 MG PO TABS: 25.0000 mg | ORAL_TABLET | Freq: Three times a day (TID) | ORAL | 12 refills | Status: DC | PRN

## 2016-04-04 MED ORDER — GABAPENTIN 100 MG PO CAPS: 100.0000 mg | ORAL_CAPSULE | Freq: Three times a day (TID) | ORAL | 12 refills | Status: DC

## 2016-04-04 MED ORDER — MELOXICAM 7.5 MG PO TABS: 7.5000 mg | ORAL_TABLET | Freq: Every day | ORAL | 5 refills | Status: AC

## 2016-04-04 MED ORDER — TRIAMTERENE-HCTZ 37.5-25 MG PO TABS: 1.0000 | ORAL_TABLET | Freq: Every day | ORAL | 3 refills | Status: DC

## 2016-04-04 NOTE — Progress Notes (Signed)
Subjective:  HPI  Patient is here for 6 months follow up.  Dizziness: In January tried to get meclizine PA and insurance wanted patient to try Compazine first which he did and did not feel it worked as well. He has been taking Meclizine but at 1/2 dose at 12.5 mg 3 times daily and has been paying out of pocket. He essentially has no use of his right arm now and very limited use of his left arm and hand. Last labs 01/17/15.  Prior to Admission medications   Medication Sig Start Date End Date Taking? Authorizing Provider  acetaminophen (RA ACETAMINOPHEN) 650 MG CR tablet Take by mouth.   Yes Historical Provider, MD  Docusate Calcium (STOOL SOFTENER PO) Take 1 tablet by mouth daily as needed.   Yes Historical Provider, MD  magnesium oxide (MAG-OX) 400 (241.3 Mg) MG tablet take 1 tablet by mouth twice a day 02/20/16  Yes Daionna Crossland Hulen Shouts., MD  meclizine (ANTIVERT) 25 MG tablet Take 1 tablet (25 mg total) by mouth 3 (three) times daily as needed for dizziness. 10/04/15  Yes Cuong Moorman Hulen Shouts., MD  Multiple Vitamins-Minerals (PRESERVISION AREDS PO) Take 1 capsule by mouth 2 (two) times daily.   Yes Historical Provider, MD  traMADol (ULTRAM) 50 MG tablet Take 1 tablet (50 mg total) by mouth every 6 (six) hours as needed. 11/16/15  Yes Lorie Phenix, MD    Patient Active Problem List   Diagnosis Date Noted  . Chronic osteoarthritis 11/16/2015  . Meniere disease 10/04/2015  . Diabetes mellitus, type 2 (HCC) 03/18/2015  . H/O varicella 03/18/2015  . Spondylosis, cervical, with myelopathy 11/07/2014  . 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 Chickenpox   . Diabetes (HCC)   . Meniere's disease     Social History   Social History  . Marital status: Married    Spouse name: N/A  . Number of children: 1  . Years of  education: college   Occupational History  . retired    Social History Main Topics  . Smoking status: Former Smoker    Years: 5.00    Types: Cigarettes  . Smokeless tobacco: Never Used  . Alcohol use No  . Drug use: No  . Sexual activity: Not on file   Other Topics Concern  . Not on file   Social History Narrative   Lives with wife in one story home.  Retired from Production manager.  Education: some Designer, fashion/clothing.      Patient drinks 2 cups of caffeine daily.   Patient is right handed.     No Known Allergies  Review of Systems  Constitutional: Positive for malaise/fatigue.  HENT: Positive for hearing loss (left ear worse) and tinnitus.   Eyes: Negative.   Respiratory: Negative.   Cardiovascular: Negative.   Gastrointestinal: Negative.   Genitourinary: Negative.   Musculoskeletal: Positive for joint pain, myalgias and neck pain.       Joint stiffness, gait abnormality. Hand swelling, muscle distrophy  Skin: Negative.   Neurological: Positive for dizziness and weakness (chronic in arms and legs).       Numbness sensation present in hands  Endo/Heme/Allergies: Negative.   Psychiatric/Behavioral: Negative.     Immunization History  Administered Date(s) Administered  . Influenza, High Dose Seasonal PF 06/27/2015  . Pneumococcal Conjugate-13 03/22/2014  . Pneumococcal  Polysaccharide-23 07/06/1998  . Td 03/29/2004  . Tdap 05/08/2011   Objective:  BP 122/64   Pulse 78   Temp 97.9 F (36.6 C)   Resp 14   Wt 128 lb (58.1 kg)   BMI 19.46 kg/m   Physical Exam  Constitutional: He is oriented to person, place, and time and well-developed, well-nourished, and in no distress.  HENT:  Head: Normocephalic and atraumatic.  Right Ear: External ear normal.  Left Ear: External ear normal.  Eyes: Conjunctivae are normal. Pupils are equal, round, and reactive to light. Right eye exhibits nystagmus. Left eye exhibits nystagmus.  Neck: Normal range  of motion. Neck supple.  Cardiovascular: Normal rate, regular rhythm, normal heart sounds and intact distal pulses.   No murmur heard. Pulmonary/Chest: Effort normal and breath sounds normal. No respiratory distress. He has no wheezes.  Musculoskeletal: He exhibits edema (in hands) and deformity. He exhibits no tenderness.       Right shoulder: He exhibits decreased range of motion.       Left shoulder: He exhibits decreased range of motion.       Right upper arm: He exhibits deformity.  Neurological: He is alert and oriented to person, place, and time. He displays weakness and abnormal stance. He exhibits abnormal muscle tone. Gait abnormal.  Psychiatric: Mood, memory, affect and judgment normal.  He has a little bit of use of the left arm and hand. Virtually none of the right arm and hand. Since is progressively gotten worse over the past 25 or so years. Asian had many workups back in the 1990s. Since then he has declined any further evaluation.  Lab Results  Component Value Date   WBC 4.0 01/17/2015   HGB 13.3 (A) 01/17/2015   HCT 38 (A) 01/17/2015   PLT 211 01/17/2015   CHOL 149 01/17/2015   TRIG 71 01/17/2015   HDL 56 01/17/2015   LDLCALC 79 01/17/2015   TSH 1.200 06/27/2015   HGBA1C 5.6 10/04/2015    CMP     Component Value Date/Time   NA 141 01/17/2015   K 5.3 01/17/2015   BUN 25 (A) 01/17/2015   CREATININE 1.0 01/17/2015   AST 19 01/17/2015   ALT 13 01/17/2015   ALKPHOS 94 01/17/2015    Assessment and Plan :  1. Fascioscapulohumeral muscular dystrophy (HCC) - TSH Patient declines referral or any further evaluation of this time. This is progressive and has affected ADLs  to a great deal More than 50% of this visit with this patient and family is spent in counseling regarding these chronic disease states. 2. Meniere disease, unspecified laterality Will try for PA for this medication again. Full dose of Meclizine helped him better than 12.5 mg. Will also try Maxzide  and see if that improves symptoms for patient. Follow. - meclizine (ANTIVERT) 25 MG tablet; Take 1 tablet (25 mg total) by mouth 3 (three) times daily as needed for dizziness.  Dispense: 90 tablet; Refill: 12  3. Hyperglycemia - Comprehensive metabolic panel - HgB A1c  4. Chronic pain Try taking Meloxicam 3 times a week, continue Tramadol use. 5. Dizziness Refill given. - meclizine (ANTIVERT) 25 MG tablet; Take 1 tablet (25 mg total) by mouth 3 (three) times daily as needed for dizziness.  Dispense: 90 tablet; Refill: 12 - CBC w/Diff/Platelet  6. Chronic fatigue - Comprehensive metabolic panel - TSH  7. Neuropathy Try Gabapentin. May need to try Lyrica instead, discussed this in details and what benefits he can have from  different medications.  Julieanne Manson MD [facility] 04/04/2016 11:24 AM

## 2016-04-05 LAB — COMPREHENSIVE METABOLIC PANEL
ALBUMIN: 4.4 g/dL (ref 3.5–4.7)
ALT: 8 IU/L (ref 0–44)
AST: 15 IU/L (ref 0–40)
Albumin/Globulin Ratio: 1.4 (ref 1.2–2.2)
Alkaline Phosphatase: 111 IU/L (ref 39–117)
BILIRUBIN TOTAL: 0.7 mg/dL (ref 0.0–1.2)
BUN / CREAT RATIO: 21 (ref 10–24)
BUN: 21 mg/dL (ref 8–27)
CALCIUM: 11 mg/dL — AB (ref 8.6–10.2)
CHLORIDE: 98 mmol/L (ref 96–106)
CO2: 30 mmol/L — ABNORMAL HIGH (ref 18–29)
Creatinine, Ser: 0.98 mg/dL (ref 0.76–1.27)
GFR, EST AFRICAN AMERICAN: 82 mL/min/{1.73_m2} (ref >59)
GFR, EST NON AFRICAN AMERICAN: 71 mL/min/{1.73_m2} (ref >59)
GLUCOSE: 91 mg/dL (ref 65–99)
Globulin, Total: 3.2 g/dL (ref 1.5–4.5)
Potassium: 5.2 mmol/L (ref 3.5–5.2)
Sodium: 141 mmol/L (ref 134–144)
TOTAL PROTEIN: 7.6 g/dL (ref 6.0–8.5)

## 2016-04-05 LAB — CBC WITH DIFFERENTIAL/PLATELET
BASOS: 0 %
Basophils Absolute: 0 10*3/uL (ref 0.0–0.2)
EOS (ABSOLUTE): 0.1 10*3/uL (ref 0.0–0.4)
Eos: 1 %
HEMOGLOBIN: 13.1 g/dL (ref 12.6–17.7)
Hematocrit: 40.4 % (ref 37.5–51.0)
IMMATURE GRANS (ABS): 0 10*3/uL (ref 0.0–0.1)
IMMATURE GRANULOCYTES: 0 %
LYMPHS: 18 %
Lymphocytes Absolute: 0.8 10*3/uL (ref 0.7–3.1)
MCH: 30 pg (ref 26.6–33.0)
MCHC: 32.4 g/dL (ref 31.5–35.7)
MCV: 93 fL (ref 79–97)
MONOCYTES: 7 %
Monocytes Absolute: 0.3 10*3/uL (ref 0.1–0.9)
NEUTROS ABS: 3.4 10*3/uL (ref 1.4–7.0)
Neutrophils: 74 %
Platelets: 221 10*3/uL (ref 150–379)
RBC: 4.36 x10E6/uL (ref 4.14–5.80)
RDW: 13.3 % (ref 12.3–15.4)
WBC: 4.7 10*3/uL (ref 3.4–10.8)

## 2016-04-05 LAB — HEMOGLOBIN A1C
Est. average glucose Bld gHb Est-mCnc: 114 mg/dL
Hgb A1c MFr Bld: 5.6 % (ref 4.8–5.6)

## 2016-04-05 LAB — TSH: TSH: 1.34 u[IU]/mL (ref 0.450–4.500)

## 2016-04-11 ENCOUNTER — Telehealth: Payer: Self-pay | Admitting: Family Medicine

## 2016-04-11 NOTE — Telephone Encounter (Signed)
Scott Crane pt's daughter calling stating that pt came in office last week had blood work done and has not heard back about his blood work. Please call Scott Crane with results @ [phone].  Thanks CC

## 2016-04-11 NOTE — Telephone Encounter (Signed)
Scott Crane

## 2016-04-11 NOTE — Telephone Encounter (Signed)
Lmtcb, see lab result note and let daughter know-aa

## 2016-05-06 ENCOUNTER — Encounter: Payer: Self-pay | Admitting: Family Medicine

## 2016-05-29 ENCOUNTER — Other Ambulatory Visit: Payer: Self-pay | Admitting: Family Medicine

## 2016-05-29 DIAGNOSIS — M1990 Unspecified osteoarthritis, unspecified site: Secondary | ICD-10-CM

## 2016-07-08 ENCOUNTER — Ambulatory Visit (INDEPENDENT_AMBULATORY_CARE_PROVIDER_SITE_OTHER): Payer: Medicare Other | Admitting: Family Medicine

## 2016-07-08 VITALS — BP 124/62 | HR 68 | Temp 97.6°F | Resp 16 | Wt 119.0 lb

## 2016-07-08 DIAGNOSIS — R1310 Dysphagia, unspecified: Secondary | ICD-10-CM | POA: Diagnosis not present

## 2016-07-08 DIAGNOSIS — R531 Weakness: Secondary | ICD-10-CM

## 2016-07-08 DIAGNOSIS — R2689 Other abnormalities of gait and mobility: Secondary | ICD-10-CM

## 2016-07-08 DIAGNOSIS — R627 Adult failure to thrive: Secondary | ICD-10-CM

## 2016-07-08 DIAGNOSIS — R6251 Failure to thrive (child): Secondary | ICD-10-CM

## 2016-07-08 DIAGNOSIS — R7309 Other abnormal glucose: Secondary | ICD-10-CM

## 2016-07-08 DIAGNOSIS — Z23 Encounter for immunization: Secondary | ICD-10-CM | POA: Diagnosis not present

## 2016-07-08 NOTE — Progress Notes (Signed)
Scott Crane  MRN: [id number] DOB: 1930-07-06  Subjective:  HPI   The patient is an 82 year old male who presents for follow up of chronic conditions, A1C and repeat Calcium level.  The patient was last seen on 04/04/16 and his A1C was 5.6.   He states that he takes the Meclizine 3 times daily regardless of symptoms.  He states that if he does not take it he has worsened symptoms.  He reports a recent episode of hearing loss and thought it to be from increased amount of salt in his diet.  Since decreasing the salt intake his hearing has gotten better.   The patient is on Gabapentin but he states he can not tell if it helps.  From what he says it does not sound as though he is taking it properly.   He has lost 9 pounds since his last visit. Occasionally has trouble swallowing his pills and sometimes food. He is complaining of increasing problems with movement. Cause of increasing muscular weakness resulting in scoliosis of his back he feels as though "my hip is gone". The other issue is beginning of skin breakdown at his sacrum from sitting. Patient Active Problem List   Diagnosis Date Noted  . Chronic osteoarthritis 11/16/2015  . Meniere disease 10/04/2015  . H/O varicella 03/18/2015  . Spondylosis, cervical, with myelopathy 11/07/2014  . 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 Chickenpox   . Diabetes (HCC)   . Meniere's disease     Social History   Social History  . Marital status: Married    Spouse name: N/A  . Number of children: 1  . Years of education: college   Occupational History  . retired    Social History Main Topics  . Smoking status: Former Smoker    Years: 5.00    Types: Cigarettes  . Smokeless tobacco: Never Used  . Alcohol use No  . Drug use: No  . Sexual activity: Not on file   Other  Topics Concern  . Not on file   Social History Narrative   Lives with wife in one story home.  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  Education: some Designer, fashion/clothingcollege and technical degree.      Patient drinks 2 cups of caffeine daily.   Patient is right handed.     Outpatient Encounter Prescriptions as of 07/08/2016  Medication Sig Note  . acetaminophen (RA ACETAMINOPHEN) 650 MG CR tablet Take by mouth. 08/23/2014: [facility]  . Docusate Calcium (STOOL SOFTENER PO) Take 1 tablet by mouth daily as needed.   . gabapentin (NEURONTIN) 100 MG capsule Take 1 capsule (100 mg total) by mouth 3 (three) times daily.   . magnesium oxide (MAG-OX) 400 (241.3 Mg) MG tablet take 1 tablet by mouth twice a day   . meclizine (ANTIVERT) 25 MG tablet Take 1 tablet (25 mg total) by mouth 3 (three) times daily as needed for dizziness.   . meloxicam (MOBIC) 7.5 MG tablet Take 1 tablet (7.5 mg total) by mouth daily.   . Multiple Vitamins-Minerals (PRESERVISION AREDS PO) Take 1 capsule by mouth 2 (two) times daily.   . traMADol (ULTRAM) 50 MG tablet take 1 tablet by mouth every 6 hours if needed for pain   . triamterene-hydrochlorothiazide (MAXZIDE-25) 37.5-25 MG tablet Take 1 tablet by mouth daily.  No facility-administered encounter medications on file as of 07/08/2016.     No Known Allergies  Review of Systems  Constitutional: Positive for malaise/fatigue and weight loss. Negative for chills and fever.  HENT: Positive for congestion (With post nasal drainage).   Eyes: Negative.   Respiratory: Negative for cough, shortness of breath and wheezing.   Cardiovascular: Negative for chest pain, palpitations, orthopnea, claudication, leg swelling and PND.  Gastrointestinal: Negative.   Genitourinary: Negative.   Musculoskeletal: Positive for back pain, joint pain and neck pain. Negative for falls and myalgias.  Neurological: Positive for dizziness and weakness. Negative for  headaches.  Psychiatric/Behavioral:       Increasedaamount of sleep   Objective:  BP 124/62 (BP Location: Left Arm, Patient Position: Sitting, Cuff Size: Small)   Pulse 68   Temp 97.6 F (36.4 C) (Oral)   Resp 16   Wt 119 lb (54 kg)   BMI 18.09 kg/m   Physical Exam  Constitutional: He is oriented to person, place, and time and well-developed, well-nourished, and in no distress.  Cachectic white male who is very weak today.  HENT:  Head: Normocephalic and atraumatic.  Right Ear: External ear normal.  Left Ear: External ear normal.  Nose: Nose normal.  Eyes: Conjunctivae are normal. No scleral icterus.  Neck: No thyromegaly present.  Cardiovascular: Normal rate, regular rhythm and normal heart sounds.   Pulmonary/Chest: Effort normal and breath sounds normal.  Abdominal: Soft.  Musculoskeletal: He exhibits deformity. He exhibits no edema.  Scoliosis of thoracic and lumbar spine.  Neurological: He is alert and oriented to person, place, and time. He exhibits abnormal muscle tone. Coordination abnormal.  Asian is able to use his left hand little is losing use of his right hand. He has no use of his right arm and very limited use of his left arm. Both just hanging off of his shoulders.  Skin: Skin is warm and dry.  Small stage I sacral decubitus  Psychiatric: Mood, memory, affect and judgment normal.    Assessment and Plan :  1. Dysphagia, unspecified type I'm concerned that this is just another symptom of neurologic issues going on. - Ambulatory referral to Neurology - Ambulatory referral to Home Health  2. Weakness   - Ambulatory referral to Neurology - Ambulatory referral to Home Health  3. Failure to thrive (0-17) Encouraged patient to eat whatever he will eat to get calories in him.  - Ambulatory referral to Home Health - Comprehensive metabolic panel - CBC with Differential/Platelet  4. Imbalance  - Ambulatory referral to Home Health  5. Elevated  glucose   - Hemoglobin A1c  6. Need for influenza vaccination  - Flu vaccine HIGH DOSE PF (Fluzone High dose) 7. Cervical myelopathy versus assisted living muscular dystrophy of arms. 8. Stage I Sacral decubitus Home health referral. Recommend consider eggcrate type cushion to sit on.  I have done the exam and reviewed the chart and it is accurate to the best of my knowledge. Julieanne Manson M.D. [facility]

## 2016-07-08 NOTE — Patient Instructions (Signed)
Recommend eggcrate mattress and eggcrate square for chair

## 2016-07-09 ENCOUNTER — Other Ambulatory Visit: Payer: Self-pay | Admitting: Emergency Medicine

## 2016-07-09 DIAGNOSIS — E875 Hyperkalemia: Secondary | ICD-10-CM

## 2016-07-09 LAB — COMPREHENSIVE METABOLIC PANEL
A/G RATIO: 1.6 (ref 1.2–2.2)
ALT: 11 IU/L (ref 0–44)
AST: 16 IU/L (ref 0–40)
Albumin: 4.5 g/dL (ref 3.5–4.7)
Alkaline Phosphatase: 98 IU/L (ref 39–117)
BUN/Creatinine Ratio: 25 — ABNORMAL HIGH (ref 10–24)
BUN: 22 mg/dL (ref 8–27)
Bilirubin Total: 0.6 mg/dL (ref 0.0–1.2)
CALCIUM: 10.8 mg/dL — AB (ref 8.6–10.2)
CO2: 32 mmol/L — AB (ref 18–29)
Chloride: 97 mmol/L (ref 96–106)
Creatinine, Ser: 0.88 mg/dL (ref 0.76–1.27)
GFR calc Af Amer: 91 mL/min/{1.73_m2} (ref >59)
GFR, EST NON AFRICAN AMERICAN: 79 mL/min/{1.73_m2} (ref >59)
GLUCOSE: 93 mg/dL (ref 65–99)
Globulin, Total: 2.8 g/dL (ref 1.5–4.5)
POTASSIUM: 5.8 mmol/L — AB (ref 3.5–5.2)
Sodium: 140 mmol/L (ref 134–144)
Total Protein: 7.3 g/dL (ref 6.0–8.5)

## 2016-07-09 LAB — CBC WITH DIFFERENTIAL/PLATELET
BASOS ABS: 0 10*3/uL (ref 0.0–0.2)
Basos: 0 %
EOS (ABSOLUTE): 0.1 10*3/uL (ref 0.0–0.4)
Eos: 2 %
HEMOGLOBIN: 13.2 g/dL (ref 12.6–17.7)
Hematocrit: 39.5 % (ref 37.5–51.0)
Immature Grans (Abs): 0 10*3/uL (ref 0.0–0.1)
Immature Granulocytes: 0 %
LYMPHS ABS: 1.1 10*3/uL (ref 0.7–3.1)
Lymphs: 23 %
MCH: 30.8 pg (ref 26.6–33.0)
MCHC: 33.4 g/dL (ref 31.5–35.7)
MCV: 92 fL (ref 79–97)
MONOCYTES: 7 %
MONOS ABS: 0.3 10*3/uL (ref 0.1–0.9)
Neutrophils Absolute: 3.3 10*3/uL (ref 1.4–7.0)
Neutrophils: 68 %
PLATELETS: 215 10*3/uL (ref 150–379)
RBC: 4.29 x10E6/uL (ref 4.14–5.80)
RDW: 14.2 % (ref 12.3–15.4)
WBC: 4.9 10*3/uL (ref 3.4–10.8)

## 2016-07-09 LAB — HEMOGLOBIN A1C
ESTIMATED AVERAGE GLUCOSE: 117 mg/dL
Hgb A1c MFr Bld: 5.7 % — ABNORMAL HIGH (ref 4.8–5.6)

## 2016-07-12 ENCOUNTER — Telehealth: Payer: Self-pay

## 2016-07-12 NOTE — Telephone Encounter (Signed)
Annabelle Harman RN from [facility] is requesting a verbal order to see patient once a week for the next three weeks. She also needs authorization for PT/OT for weakness in both legs.  [phone].KW

## 2016-07-12 NOTE — Telephone Encounter (Signed)
Trinidad Curetana advised-aa

## 2016-07-12 NOTE — Telephone Encounter (Signed)
That's fine

## 2016-07-16 LAB — CALCIUM, IONIZED: Calcium, Ion: 6 mg/dL — ABNORMAL HIGH (ref 4.5–5.6)

## 2016-07-16 LAB — PTH, INTACT AND CALCIUM
Calcium: 10.8 mg/dL — ABNORMAL HIGH (ref 8.6–10.2)
PTH: 49 pg/mL (ref 15–65)

## 2016-07-16 LAB — POTASSIUM: Potassium: 4.5 mmol/L (ref 3.5–5.2)

## 2016-07-16 NOTE — Telephone Encounter (Signed)
fyi-aa 

## 2016-07-16 NOTE — Telephone Encounter (Signed)
Lemuel at [facility] called to report that they will start PT eval. Will be on Friday.  Thank sTeri

## 2016-07-18 NOTE — Telephone Encounter (Signed)
Don advised-aa 

## 2016-07-18 NOTE — Telephone Encounter (Signed)
Don with [facility] would like verbal orders for occupational therapy once a week for 4 weeks for education in pain control, ADL, exercise, fall prevention, health promotion, & infection prevention. Ok to discharge when goals are met or max potential. Please advise. Thanks TNP

## 2016-07-18 NOTE — Telephone Encounter (Signed)
ok 

## 2016-07-18 NOTE — Telephone Encounter (Signed)
Ok to order 

## 2016-07-19 ENCOUNTER — Telehealth: Payer: Self-pay

## 2016-07-19 NOTE — Telephone Encounter (Signed)
Lemuel from [facility] states that he will be seeing patient once a week for the next 3 weeks. He states he will be focusing more on home exercise programs such ans fall prevention, gait/strength training. He request a verbal order to proceed with visits. I informed Clayton LefortLemuel that Dr. Sullivan LoneGilbert is out of office today and will return on Monday, he stats this is non urgent and can be addressed next week. KW

## 2016-07-19 NOTE — Telephone Encounter (Signed)
Ok for referral?

## 2016-07-19 NOTE — Telephone Encounter (Signed)
Lemual advised-aa

## 2016-07-20 DIAGNOSIS — M6281 Muscle weakness (generalized): Secondary | ICD-10-CM | POA: Diagnosis not present

## 2016-07-20 DIAGNOSIS — R1310 Dysphagia, unspecified: Secondary | ICD-10-CM | POA: Diagnosis not present

## 2016-07-24 DIAGNOSIS — R262 Difficulty in walking, not elsewhere classified: Secondary | ICD-10-CM | POA: Diagnosis not present

## 2016-07-24 DIAGNOSIS — M6281 Muscle weakness (generalized): Secondary | ICD-10-CM | POA: Diagnosis not present

## 2016-07-26 ENCOUNTER — Telehealth: Payer: Self-pay | Admitting: Family Medicine

## 2016-07-26 DIAGNOSIS — R1310 Dysphagia, unspecified: Secondary | ICD-10-CM

## 2016-07-26 NOTE — Telephone Encounter (Signed)
Scott Crane with [name] is request an order for a hospital bed sent to [facility] due to pt is not able to sleep in a regular bed because of muscle skeletal issues.  [facility].  CB#[phone]/MW

## 2016-07-26 NOTE — Telephone Encounter (Signed)
Proceed with order for hospital bed with egg crate mattress for this patient with a fascioscapulohumeral muscular dystrophy of arms and stage I sacral decubitus.

## 2016-07-26 NOTE — Telephone Encounter (Signed)
Spoke with Scott Crane on phone and informed her that Dr. Sullivan Lone is out of office and will return Monday. Scott Crane states that issue can wait to be addressed than. KW

## 2016-07-26 NOTE — Telephone Encounter (Signed)
Dr. Sullivan LoneGilbert is out of office next week can you please review and advise?

## 2016-07-26 NOTE — Telephone Encounter (Signed)
[facility] with [facility] called states she has complete a swallowing evaluation on 07/24/16.  She is not recommending speech therapy but will need a modified barium swallow test done.  Scott Crane is requesting a call back. CB#[phone]/MW

## 2016-07-29 NOTE — Telephone Encounter (Signed)
Order written for hospital bed with egg crate mattress for home with diagnosis of fascioscapulohumeral muscular dystrophy and stage I sacral decubitus. Please fax to [facility] [phone] - Attention: Annabelle HarmanDana.

## 2016-07-29 NOTE — Telephone Encounter (Signed)
Ok to order the modified barium swallow if that is test recommended.

## 2016-07-29 NOTE — Telephone Encounter (Signed)
Scott Crane states that she needs order to be written and faxed to [facility] [phone]. She states that on note it needs to have DME written on note for medical supply equipment company.

## 2016-07-29 NOTE — Telephone Encounter (Signed)
Nurse was advised. KW 

## 2016-07-30 NOTE — Telephone Encounter (Signed)
Order faxed to [facility] at [phone].

## 2016-07-30 NOTE — Telephone Encounter (Signed)
Kim called back office wanting to know if we will be placing order for modified barium swallow test and if we can schedule it for 08/30/16. Please review. KW

## 2016-07-30 NOTE — Addendum Note (Signed)
Addended by: Fonda KinderWOLFORD, Saraia Platner J on: 07/30/2016 11:35 AM   Modules accepted: Orders

## 2016-08-06 NOTE — Telephone Encounter (Signed)
[facility]

## 2016-08-08 ENCOUNTER — Other Ambulatory Visit: Payer: Self-pay | Admitting: Family Medicine

## 2016-08-08 DIAGNOSIS — R1312 Dysphagia, oropharyngeal phase: Secondary | ICD-10-CM

## 2016-08-21 ENCOUNTER — Ambulatory Visit
Admission: RE | Admit: 2016-08-21 | Discharge: 2016-08-21 | Disposition: A | Discharge status: Home / Self Care | Payer: Medicare Other | Source: Ambulatory Visit | Attending: Family Medicine | Admitting: Family Medicine

## 2016-08-21 DIAGNOSIS — R1310 Dysphagia, unspecified: Secondary | ICD-10-CM | POA: Insufficient documentation

## 2016-08-21 DIAGNOSIS — R1312 Dysphagia, oropharyngeal phase: Secondary | ICD-10-CM

## 2016-08-21 NOTE — Therapy (Signed)
[facility]IOLOG[facility] [address] [place], [place], [address] Phone: [phone]   Fax:     Modified Barium Swallow  Patient Details  Name: Scott Crane MRN: [id number] Date of Birth: 04-29-1930 No Data Recorded  Encounter Date: 08/21/2016    Past Medical History:  Diagnosis Date  . Chickenpox   . Diabetes (HCC)   . Meniere's disease     Past Surgical History:  Procedure Laterality Date  . CATARACT EXTRACTION    . MUSCLE BIOPSY      There were no vitals filed for this visit.   Subjective: Patient behavior: (alertness, ability to follow instructions, etc.): Patient alert, able to follow directions, able to perform selected swallowing exercises  Chief complaint: subjective complaint of difficulty swallowing pills, he has been followed by home health SLP with recommendation for an MBSS   Objective:  Radiological Procedure: A videoflouroscopic evaluation of oral-preparatory, reflex initiation, and pharyngeal phases of the swallow was performed; as well as a screening of the upper esophageal phase.  I. POSTURE: Upright in MBS chair.  Due to his muscular dystrophy, patient requires assistance to maintain his mands near his mouth for self feeding  II. VIEW: Lateral  III. COMPENSATORY STRATEGIES: Multiple swallows aids pharyngeal clearance  IV. BOLUSES ADMINISTERED:   Thin Liquid: 1 single sip, 2 rapid consecutive sips   Nectar-thick Liquid: 1 moderate size bolus    Puree: 2 teaspoon presentations   Mechanical Soft: 1/4 graham cracker in applesauce   Barium tablet  V. RESULTS OF EVALUATION: A. ORAL PREPARATORY PHASE: (The lips, tongue, and velum are observed for strength and coordination)       **Overall Severity Rating: Mild; disorganized posterior movement  B. SWALLOW INITIATION/REFLEX: (The reflex is normal if "triggered" by the time the bolus reached the base of the tongue)  **Overall Severity Rating:  Moderate; triggers while falling from the valleculae to the pyriform sinuses  C. PHARYNGEAL PHASE: (Pharyngeal function is normal if the bolus shows rapid, smooth, and continuous transit through the pharynx and there is no pharyngeal residue after the swallow)  **Overall Severity Rating: Moderate; decreased tongue base retraction, decreased anterior hyoid movement (elevation of larynx is [facility]), incomplete epiglottic inversion; moderate residue throughout pharynx; spontaneous multiple swallows  D. LARYNGEAL PENETRATION: (Material entering into the laryngeal inlet/vestibule but not aspirated) Flash penetration X1 (thin liquid)  E. ASPIRATION: None  F. ESOPHAGEAL PHASE: (Screening of the upper esophagus) No observed abnormality within the viewable cervical esophagus  ASSESSMENT: This 82 year old man; with fascioscapulohumeral muscular dystrophy; is presenting with moderate oropharyngeal dysphagia characterized by slow and disorganized oral management, delayed pharyngeal swallow initiation, decreased tongue base retraction, decreased anterior hyoid movement, incomplete epiglottic inversion, and moderate pharyngeal residue.  The patient spontaneously generates multiple swallows, which aids pharyngeal clearance.  There was one episode of flash penetration and no observed aspiration.   A 12.5 barium tablet lodged in the valleculae; liquid wash was not effective in moving tablet along.  The patient would benefit from exercises to improve pharyngeal pressure and anterior hyoid movement.  He was able to perform sample exercises and seems to be a good candidate for a swallowing HEP.  PLAN/RECOMMENDATIONS:   A. Diet: usual diet   B. Swallowing Precautions: Multiple swallows, alternate solids and liquids   C. Recommended consultation to N/A   D. Therapy recommendations: speech therapy to develop/implement swallowing exercise routine targeting improved pharyngeal pressure and anterior hyoid movement.    E.  Results and  recommendations were discussed with the patient and his daughter and the final report routed to referring MD.    Patient will benefit from skilled therapeutic intervention in order to improve the following deficits and impairments:   Oropharyngeal dysphagia - Plan: DG OP Swallowing Func-Medicare/Speech Path, DG OP Swallowing Func-Medicare/Speech Path      G-Codes - 09/19/2016 1636    Functional Assessment Tool Used MBSS, clinical judgment   Functional Limitations Swallowing   Swallow Current Status (H9977) At least 40 percent but less than 60 percent impaired, limited or restricted   Swallow Goal Status (S1423) At least 40 percent but less than 60 percent impaired, limited or restricted   Swallow Discharge Status [phone]) At least 40 percent but less than 60 percent impaired, limited or restricted          Problem List Patient Active Problem List   Diagnosis Date Noted  . Chronic osteoarthritis 11/16/2015  . Meniere disease 10/04/2015  . H/O varicella 03/18/2015  . Spondylosis, cervical, with myelopathy 11/07/2014  . 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 [place], [place], [address] Phone: [phone]   Fax:     Name: Scott Crane MRN: [id number] Date of Birth: Jul 11, 1930

## 2016-08-22 ENCOUNTER — Telehealth: Payer: Self-pay

## 2016-08-22 NOTE — Telephone Encounter (Signed)
Patients daughter who is power of attorney was advised. KW

## 2016-08-22 NOTE — Telephone Encounter (Signed)
-----   Message from Maple Hudsonichard L Gilbert Jr., MD sent at 08/22/2016  7:51 AM EST ----- Swallowing function okay the patient does have trouble with large pills. Patient should have appointment with speech pathology

## 2016-08-29 ENCOUNTER — Telehealth: Payer: Self-pay | Admitting: Family Medicine

## 2016-08-29 ENCOUNTER — Ambulatory Visit (INDEPENDENT_AMBULATORY_CARE_PROVIDER_SITE_OTHER): Payer: Medicare Other | Admitting: Family Medicine

## 2016-08-29 VITALS — BP 118/60 | HR 72 | Temp 97.6°F | Resp 14 | Wt 119.0 lb

## 2016-08-29 DIAGNOSIS — G629 Polyneuropathy, unspecified: Secondary | ICD-10-CM | POA: Insufficient documentation

## 2016-08-29 DIAGNOSIS — G710 Muscular dystrophy: Secondary | ICD-10-CM | POA: Diagnosis not present

## 2016-08-29 DIAGNOSIS — G1221 Amyotrophic lateral sclerosis: Secondary | ICD-10-CM

## 2016-08-29 DIAGNOSIS — R6251 Failure to thrive (child): Secondary | ICD-10-CM | POA: Insufficient documentation

## 2016-08-29 DIAGNOSIS — E789 Disorder of lipoprotein metabolism, unspecified: Secondary | ICD-10-CM

## 2016-08-29 DIAGNOSIS — R739 Hyperglycemia, unspecified: Secondary | ICD-10-CM | POA: Insufficient documentation

## 2016-08-29 DIAGNOSIS — H8103 Meniere's disease, bilateral: Secondary | ICD-10-CM

## 2016-08-29 DIAGNOSIS — R1310 Dysphagia, unspecified: Secondary | ICD-10-CM

## 2016-08-29 DIAGNOSIS — R627 Adult failure to thrive: Secondary | ICD-10-CM | POA: Diagnosis not present

## 2016-08-29 DIAGNOSIS — G830 Diplegia of upper limbs: Secondary | ICD-10-CM | POA: Diagnosis not present

## 2016-08-29 DIAGNOSIS — G7102 Facioscapulohumeral muscular dystrophy: Secondary | ICD-10-CM

## 2016-08-29 NOTE — Progress Notes (Signed)
Scott Crane  MRN: [id number] DOB: 09-Jun-1930  Subjective:  HPI  Patient is here for follow up. Last office visit was on 07/08/16 for Dysphagia-referred to neurology and home health. Barrium swallow test was done and it was ok except for seen that patient has trouble with large pills. Speech therapy order and will get this done after holidays. Patient is drinking more fluids-water. Therapist came out and saw patient and family and advised patient to try throat exercises. Patient states swallowing issues is better. Appointment is made to see neurologist on January 12th.  Wt Readings from Last 3 Encounters:  08/29/16 119 lb (54 kg)  07/08/16 119 lb (54 kg)  04/04/16 128 lb (58.1 kg)   His re check calcium and PTH was done on 07/15/16 and PTH was normal but calcium still elevated and note was made to discuss on the next visit. Calcium ionized was 6.0-abnormal and Calcium was 10.8-abnormal, PTH was 49-normal.  Patient Active Problem List   Diagnosis Date Noted  . Hyperglycemia 08/29/2016  . Neuropathy (HCC) 08/29/2016  . Chronic osteoarthritis 11/16/2015  . Meniere disease 10/04/2015  . H/O varicella 03/18/2015  . Spondylosis, cervical, with myelopathy 11/07/2014  . 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 Chickenpox   . Diabetes (HCC)   . Meniere's disease     Social History   Social History  . Marital status: Married    Spouse name: N/A  . Number of children: 1  . Years of education: college   Occupational History  . retired    Social History Main Topics  . Smoking status: Former Smoker    Years: 5.00    Types: Cigarettes  . Smokeless tobacco: Never Used  . Alcohol use No  . Drug use: No  . Sexual activity: Not on file   Other Topics Concern  . Not on file   Social History Narrative   Lives  with wife in one story home.  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  Education: some Designer, fashion/clothingcollege and technical degree.      Patient drinks 2 cups of caffeine daily.   Patient is right handed.     Outpatient Encounter Prescriptions as of 08/29/2016  Medication Sig Note  . acetaminophen (RA ACETAMINOPHEN) 650 MG CR tablet Take by mouth. 08/23/2014: [facility]  . Docusate Calcium (STOOL SOFTENER PO) Take 1 tablet by mouth daily as needed.   . gabapentin (NEURONTIN) 100 MG capsule Take 1 capsule (100 mg total) by mouth 3 (three) times daily.   . magnesium oxide (MAG-OX) 400 (241.3 Mg) MG tablet take 1 tablet by mouth twice a day   . meclizine (ANTIVERT) 25 MG tablet Take 1 tablet (25 mg total) by mouth 3 (three) times daily as needed for dizziness.   . meloxicam (MOBIC) 7.5 MG tablet Take 1 tablet (7.5 mg total) by mouth daily.   . Multiple Vitamins-Minerals (PRESERVISION AREDS PO) Take 1 capsule by mouth 2 (two) times daily.   . traMADol (ULTRAM) 50 MG tablet take 1 tablet by mouth every 6 hours if needed for pain   . triamterene-hydrochlorothiazide (MAXZIDE-25) 37.5-25 MG tablet Take 1 tablet by mouth daily.    No facility-administered encounter medications on file as of 08/29/2016.     No Known Allergies  Review of Systems  HENT: Positive for congestion.   Eyes: Negative.  Respiratory: Negative.   Cardiovascular: Negative.   Gastrointestinal: Negative.   Musculoskeletal: Positive for joint pain (hip pain. ) and neck pain.       Joint stiffness, hands, arms and shoulders are numb and tingling  Neurological: Positive for tingling and weakness (arms). Negative for dizziness.       Unsteady due to weakness, using cane.  Endo/Heme/Allergies: Negative.     Objective:  BP 118/60   Pulse 72   Temp 97.6 F (36.4 C)   Resp 14   Wt 119 lb (54 kg)   BMI 18.09 kg/m   Physical Exam  Constitutional: He is oriented to person, place, and time and  well-developed, well-nourished, and in no distress.  HENT:  Head: Normocephalic and atraumatic.  Right Ear: External ear normal.  Left Ear: External ear normal.  Nose: Nose normal.  Eyes: Conjunctivae are normal. Pupils are equal, round, and reactive to light. Right eye exhibits nystagmus. Left eye exhibits nystagmus.  Cardiovascular: Normal rate, regular rhythm, normal heart sounds and intact distal pulses.   No murmur heard. Pulmonary/Chest: Effort normal and breath sounds normal. No respiratory distress. He has no wheezes.  Abdominal: Soft.  Neurological: He is alert and oriented to person, place, and time. No cranial nerve deficit.  Pt has a little grip strength in left hand but has almost zero use of both arms above the hand.   Skin: Skin is warm and dry.  Psychiatric: Mood, memory, affect and judgment normal.    Assessment and Plan :  1. Dysphagia, unspecified type Better. Has appointment set up with neurologist on 09/20/16 and has speech therapy set up for after holidays.  2. Facioscapulohumeral muscular dystrophy (HCC) Progressive issue . He has had evaluations by neurology in past at [facility] and [facility] and has appt with Dr Sherryll Burger in near future. 3. Brachial amyotrophic diplegia (HCC)  4. Diplegia (HCC)  5. Disorder of lipoid metabolism Last lipid check was 01/17/15-controlled. 6. Elevated calcium levels The past 2 checks levels have been up. Discussed this with patient and his daughter and what work up needs to be done. Will re check levels today and will refer patient to endocrinology for further work up. His disease could be contributing to the levels been up. 7. Meniere's disease of both ears Stable.  8. Failure to thrive (0-17) Advised patient to eat whatever he wants to. Spoke with patient about his A1C. Weight unchanged from October visit. Would like to see patient gain some more weight. 9.Prediabetes HPI, Exam and A&P transcribed under direction and in the presence of  Julieanne Manson, MD. I have done the exam and reviewed the chart and it is accurate to the best of my knowledge. Dentist has been used and  any errors in dictation or transcription are unintentional. Julieanne Manson M.D. [facility]

## 2016-08-29 NOTE — Telephone Encounter (Signed)
Kim advised-aa 

## 2016-08-29 NOTE — Telephone Encounter (Signed)
ok 

## 2016-08-29 NOTE — Telephone Encounter (Signed)
Kim with [name] called saying his family wants to wait until after Christmas to start his speech therapy to start after the first of the year.  First week in January  They a verbal order.  Call back is  [phone]  Thanks, [employer]

## 2016-08-29 NOTE — Telephone Encounter (Signed)
Need verbal order if this is ok-aa

## 2016-08-30 LAB — COMPREHENSIVE METABOLIC PANEL
ALBUMIN: 4.3 g/dL (ref 3.5–4.7)
ALK PHOS: 92 IU/L (ref 39–117)
ALT: 13 IU/L (ref 0–44)
AST: 16 IU/L (ref 0–40)
Albumin/Globulin Ratio: 1.5 (ref 1.2–2.2)
BILIRUBIN TOTAL: 0.6 mg/dL (ref 0.0–1.2)
BUN / CREAT RATIO: 33 — AB (ref 10–24)
BUN: 30 mg/dL — AB (ref 8–27)
CHLORIDE: 94 mmol/L — AB (ref 96–106)
CO2: 31 mmol/L — AB (ref 18–29)
CREATININE: 0.92 mg/dL (ref 0.76–1.27)
Calcium: 10.6 mg/dL — ABNORMAL HIGH (ref 8.6–10.2)
GFR calc Af Amer: 88 mL/min/{1.73_m2} (ref >59)
GFR calc non Af Amer: 76 mL/min/{1.73_m2} (ref >59)
GLUCOSE: 95 mg/dL (ref 65–99)
Globulin, Total: 2.9 g/dL (ref 1.5–4.5)
Potassium: 4.6 mmol/L (ref 3.5–5.2)
SODIUM: 138 mmol/L (ref 134–144)
Total Protein: 7.2 g/dL (ref 6.0–8.5)

## 2016-09-03 ENCOUNTER — Telehealth: Payer: Self-pay

## 2016-09-03 NOTE — Telephone Encounter (Signed)
Patient's daughter Scott Crane advised per DPR. sd

## 2016-09-11 DIAGNOSIS — I2510 Atherosclerotic heart disease of native coronary artery without angina pectoris: Secondary | ICD-10-CM

## 2016-09-11 DIAGNOSIS — I480 Paroxysmal atrial fibrillation: Secondary | ICD-10-CM

## 2016-09-11 DIAGNOSIS — I5023 Acute on chronic systolic (congestive) heart failure: Secondary | ICD-10-CM

## 2016-09-11 DIAGNOSIS — N184 Chronic kidney disease, stage 4 (severe): Secondary | ICD-10-CM

## 2016-09-11 DIAGNOSIS — F17210 Nicotine dependence, cigarettes, uncomplicated: Secondary | ICD-10-CM

## 2016-09-11 DIAGNOSIS — I130 Hypertensive heart and chronic kidney disease with heart failure and stage 1 through stage 4 chronic kidney disease, or unspecified chronic kidney disease: Secondary | ICD-10-CM

## 2016-09-11 DIAGNOSIS — F449 Dissociative and conversion disorder, unspecified: Secondary | ICD-10-CM

## 2016-09-25 ENCOUNTER — Telehealth: Payer: Self-pay | Admitting: Family Medicine

## 2016-09-25 NOTE — Telephone Encounter (Signed)
Called Pt to schedule AWV with [facility] 10:45AM 4/24 30 Minute appt - knb

## 2016-09-27 ENCOUNTER — Telehealth: Payer: Self-pay | Admitting: Family Medicine

## 2016-09-27 NOTE — Telephone Encounter (Signed)
Please review-aa 

## 2016-09-27 NOTE — Telephone Encounter (Signed)
Kim with [name] did speech eval and wants to order 2 speech therapy visits for dysphagia.  Kims call back is [phone]  Thanks Barth Kirks

## 2016-09-28 NOTE — Telephone Encounter (Signed)
ok 

## 2016-09-30 NOTE — Telephone Encounter (Signed)
Kim at [place] advised. Allene Dillon, CMA

## 2016-10-04 ENCOUNTER — Other Ambulatory Visit: Payer: Self-pay | Admitting: Family Medicine

## 2016-10-07 ENCOUNTER — Telehealth: Payer: Self-pay | Admitting: Family Medicine

## 2016-10-07 NOTE — Telephone Encounter (Signed)
Is this ok?-aa 

## 2016-10-07 NOTE — Telephone Encounter (Signed)
Kim with [name] is requesting a verbal ok to reschedule pt appointment for this week, 10/08/2016 to 10/18/2016 so pt daughter can be present at the appointment.  CB#[phone]/MW

## 2016-10-07 NOTE — Telephone Encounter (Signed)
Kim advised. Emily Drozdowski, CMA  

## 2016-10-07 NOTE — Telephone Encounter (Signed)
ok 

## 2016-10-10 DIAGNOSIS — M5000 Cervical disc disorder with myelopathy, unspecified cervical region: Secondary | ICD-10-CM | POA: Insufficient documentation

## 2016-10-31 DIAGNOSIS — R531 Weakness: Secondary | ICD-10-CM | POA: Diagnosis not present

## 2016-10-31 DIAGNOSIS — R1312 Dysphagia, oropharyngeal phase: Secondary | ICD-10-CM | POA: Diagnosis not present

## 2016-11-15 ENCOUNTER — Telehealth: Payer: Self-pay | Admitting: Family Medicine

## 2016-11-15 NOTE — Telephone Encounter (Signed)
Called Pt to schedule AWV with [facility] - knb °

## 2016-12-31 ENCOUNTER — Ambulatory Visit (INDEPENDENT_AMBULATORY_CARE_PROVIDER_SITE_OTHER): Payer: Medicare Other | Admitting: Family Medicine

## 2016-12-31 ENCOUNTER — Encounter: Payer: Self-pay | Admitting: Family Medicine

## 2016-12-31 VITALS — BP 108/58 | HR 80 | Temp 98.5°F | Resp 16 | Wt 117.0 lb

## 2016-12-31 DIAGNOSIS — G710 Muscular dystrophy: Secondary | ICD-10-CM | POA: Diagnosis not present

## 2016-12-31 DIAGNOSIS — R739 Hyperglycemia, unspecified: Secondary | ICD-10-CM

## 2016-12-31 DIAGNOSIS — H8103 Meniere's disease, bilateral: Secondary | ICD-10-CM

## 2016-12-31 DIAGNOSIS — M5000 Cervical disc disorder with myelopathy, unspecified cervical region: Secondary | ICD-10-CM

## 2016-12-31 DIAGNOSIS — R6251 Failure to thrive (child): Secondary | ICD-10-CM

## 2016-12-31 DIAGNOSIS — G7102 Facioscapulohumeral muscular dystrophy: Secondary | ICD-10-CM

## 2016-12-31 NOTE — Patient Instructions (Addendum)
Please limit Mobic to twice a week. (Do not take any other NSAID's with Mobic, including ibuprofen or Aleve.) You can take Tylenol 500 mg twice a day. You can also take Tramadol with the Tylenol.

## 2016-12-31 NOTE — Progress Notes (Signed)
Patient: Scott Crane Male    DOB: 1929/11/16   83 y.o.   MRN: [id number] Visit Date: 12/31/2016  Today's Provider: Megan Mans, MD   Chief Complaint  Patient presents with  . Hypercalcemia  . Meniere's Disease   Subjective:    HPI   Pt is here to follow up on his chronic problems, including facioscapulohumeral muscular dystrophy. Pt saw Dr. Sherryll Burger for DDD on 12/19/2016, and Dr. Sherryll Burger started pt on Baclofen. Pt states the muscle relaxer has really improved sx, and even improved his constipation.     Lab Results  Component Value Date   CALCIUM 10.6 (H) 08/29/2016  Pt was referred to endocrinology for elevated calcium levels. Calcium level was rechecked by Dr. Deloria Lair yesterday, and his calcium was 8.9 at that time. This was found to be from hyperparathyroidism. Pt has a F/U with Dr. Deloria Lair on Thursday.  Pt also saw Dr. Jenne Campus about 2 weeks to assess hearing loss secondary to Meniere's Disease. Dr. Nance Pew assessment showed that pt is deaf in left ear, and has 56% use of right ear. Dr. Jenne Campus advised pt to consider hearing aid of right ear, and pt would like to discuss this.  Pt is continuing to lose weight. Pt's niece, who is POA, states that pt is concerned about his diet due to elevated blood glucose and elevated calcium. Pt weighs 117 lbs today. Pt weighed 137 lb on 06/27/2015. Wt Readings from Last 3 Encounters:  12/31/16 117 lb (53.1 kg)  08/29/16 119 lb (54 kg)  07/08/16 119 lb (54 kg)      Allergies  Allergen Reactions  . Duloxetine Other (See Comments)    Joint pain     Current Outpatient Prescriptions:  .  Acetaminophen (TYLENOL) 325 MG CAPS, Take 2 capsules by mouth 3 (three) times daily., Disp: , Rfl:  .  baclofen (LIORESAL) 10 MG tablet, Take 1 tablet by mouth 2 (two) times daily as needed., Disp: , Rfl: 0 .  Magnesium Oxide 400 (240 Mg) MG TABS, take 1 tablet by mouth twice a day, Disp: 60 tablet, Rfl: 5 .  meclizine (ANTIVERT) 25 MG tablet,  Take 1 tablet (25 mg total) by mouth 3 (three) times daily as needed for dizziness., Disp: 90 tablet, Rfl: 12 .  multivitamin-lutein (OCUVITE-LUTEIN) CAPS capsule, Take 1 capsule by mouth daily., Disp: , Rfl:  .  triamterene-hydrochlorothiazide (MAXZIDE-25) 37.5-25 MG tablet, Take 1 tablet by mouth daily., Disp: 90 tablet, Rfl: 3 .  Docusate Calcium (STOOL SOFTENER PO), Take 1 tablet by mouth daily as needed., Disp: , Rfl:  .  gabapentin (NEURONTIN) 100 MG capsule, Take 1 capsule (100 mg total) by mouth 3 (three) times daily. (Patient not taking: Reported on 12/31/2016), Disp: 90 capsule, Rfl: 12 .  meloxicam (MOBIC) 7.5 MG tablet, Take 1 tablet (7.5 mg total) by mouth daily. (Patient not taking: Reported on 12/31/2016), Disp: 30 tablet, Rfl: 5  Review of Systems  Constitutional: Positive for activity change (due to facioscapulohumeral dystrophy), appetite change and unexpected weight change (lost 20 pounds in 1.5 years). Negative for chills and diaphoresis.  HENT: Positive for hearing loss.   Respiratory: Negative.   Cardiovascular: Negative.   Gastrointestinal: Negative.   Endocrine: Negative.   Allergic/Immunologic: Negative.   Neurological: Positive for numbness.  Psychiatric/Behavioral: Negative.     Social History  Substance Use Topics  . Smoking status: Former Smoker    Years: 5.00    Types: Cigarettes  . Smokeless  tobacco: Never Used  . Alcohol use No   Objective:   BP (!) 108/58 (BP Location: Left Arm, Patient Position: Sitting, Cuff Size: Normal)   Pulse 80   Temp 98.5 F (36.9 C) (Oral)   Resp 16   Wt 117 lb (53.1 kg)   BMI 17.79 kg/m  Vitals:   12/31/16 1136  BP: (!) 108/58  Pulse: 80  Resp: 16  Temp: 98.5 F (36.9 C)  TempSrc: Oral  Weight: 117 lb (53.1 kg)     Physical Exam  Constitutional: He is oriented to person, place, and time. He appears well-developed.  HENT:  Head: Normocephalic and atraumatic.  Right Ear: External ear normal.  Left Ear:  External ear normal.  Nose: Nose normal.  Eyes: Conjunctivae are normal.  Neck: Normal range of motion. Neck supple. No thyromegaly present.  Cardiovascular: Normal rate, regular rhythm and normal heart sounds.   Pulmonary/Chest: Breath sounds normal. No respiratory distress.  Abdominal: Soft.  Neurological: He is alert and oriented to person, place, and time.  Skin: Skin is warm and dry.  Psychiatric: He has a normal mood and affect. His behavior is normal. Judgment and thought content normal.        Assessment & Plan:     1. Atrophy, facioscapulohumeral (HCC) Old Dx. 2. Meniere's disease of both ears Niece is concerned about diet with Meniere's Disease. [facility] - Referral to Nutrition and Diabetes Services  3. Hypercalcemia Calcium level improved. Was 8.9 12/30/2016. Continue F/U with Dr. Deloria Lair as scheduled.  4. Hyperglycemia Advised pt this is stable. Lab Results  Component Value Date   HGBA1C 5.7 (H) 07/08/2016    5. Cervical disc disease with myelopathy Continue to F/U with Dr. Sherryll Burger as scheduled.  6. Failure to thrive  Encouraged pt to eat whatever he wants. Consider referral to palliative care in the future.    Patient seen and examined by Julieanne Manson, MD, and note scribed by Allene Dillon, CMA. I have done the exam and reviewed the above chart and it is accurate to the best of my knowledge. Dentist has been used in this note in any air is in the dictation or transcription are unintentional.  Megan Mans, MD  [facility]

## 2017-01-17 ENCOUNTER — Encounter: Payer: Self-pay | Admitting: Dietician

## 2017-01-17 ENCOUNTER — Encounter: Payer: Medicare Other | Attending: Family Medicine | Admitting: Dietician

## 2017-01-17 VITALS — Ht 64.0 in | Wt 115.9 lb

## 2017-01-17 DIAGNOSIS — Z713 Dietary counseling and surveillance: Secondary | ICD-10-CM | POA: Diagnosis not present

## 2017-01-17 DIAGNOSIS — R739 Hyperglycemia, unspecified: Secondary | ICD-10-CM | POA: Insufficient documentation

## 2017-01-17 DIAGNOSIS — R627 Adult failure to thrive: Secondary | ICD-10-CM | POA: Diagnosis not present

## 2017-01-17 DIAGNOSIS — M1990 Unspecified osteoarthritis, unspecified site: Secondary | ICD-10-CM | POA: Diagnosis not present

## 2017-01-17 DIAGNOSIS — R634 Abnormal weight loss: Secondary | ICD-10-CM | POA: Insufficient documentation

## 2017-01-17 DIAGNOSIS — Z681 Body mass index (BMI) 19 or less, adult: Secondary | ICD-10-CM | POA: Insufficient documentation

## 2017-01-17 DIAGNOSIS — H8103 Meniere's disease, bilateral: Secondary | ICD-10-CM | POA: Insufficient documentation

## 2017-01-17 NOTE — Patient Instructions (Addendum)
-   Practice following low sodium diet parameters, paying particular attention to meals ordered "out" and packaged foods.  - Try incorporating a nutritional shake into your day to provide extra calories and protein to your day. Will help with weight gain. You can even make your own low-sodium version at home.  - Eat foods high in the good types of fats (unsaturated) to help with weight gain.

## 2017-01-17 NOTE — Progress Notes (Signed)
Medical Nutrition Therapy: Visit start time: 1030  end time: 1130  Assessment:  Diagnosis: Meniere's Disease Past medical history: hyperglycemia, adult FTT, hypercalcemia, osteoarthritis, see chart Psychosocial issues/ stress concerns: none Preferred learning method:  . Auditory  Current weight: 115.9lb  Height: 5\' 4"  Medications, supplements: Vit D3, MVI, Lutein, Anivert, Magnesium oxide, see chart for full list  Progress and evaluation: Patient and daughter desire to increase knowledge about following a low-sodium diet to help manage patient's Meniere's disease. He also reports trying to follow a low sugar diet d/t h/o hyperglycemia per his report. Daughter and patient both state muscle loss has been occurring "for years" though believe his PRO intake to be adequate. Currently underweight per BMI with physical signs of malnourishment apparent. Reports to have been at goal weight (weight not listed) 7-8 years ago. Dietary recall reveals low intake of fruit. He is proficient in reading food labels and aims to ingest 1800 mg sodium/day. High intake of Healthy Choice or other lower-sodium brand frozen meals. Coupons and sample of Pronourish protein shake provided; lowest in sodium of samples available to encourage weight gain.   Physical activity: none  Dietary Intake:  Usual eating pattern includes 3 meals and 1 snacks per day. Dining out frequency: 2 meals per week.  Breakfast: cereal (cheerios) with milk and banana, pecan pastry, boiled eggs, toast with jelly, biscuits or pancakes very seldom, grits Snack: n/a Lunch: Healthy Choice frozen meals, sandwich, [place]: salmon or chicken with vegetables and beans and sometimes potato salad or sweet potato Snack: n/a Supper: similar to lunch. May eat leftovers from [facility], sandwich, Healthy Choice or other "TV dinner", pasta dish from [facility], occasional hot dog or BBQ (1-2x/month) Snack: PB & J sandwich with applecider vinegar "shot"  in water, [employer] bar Beverages: water, diet cola, coffee seldomly  Nutrition Care Education: Topics covered: low sodium diet, adding calories to meals/snacks, protein and muscle loss, weight gain, healthy fats/ omega 3s Basic nutrition: basic food groups, appropriate nutrient balance, appropriate meal and snack schedule, general nutrition guidelines    Weight: behavioral changes for weight gain Advanced nutrition:  recipe modification, cooking techniques, dining out, food label reading Meniere's Disease: identifying high sodium foods, caffeine intake, fluid balance Other lifestyle changes: benefits of making changes, readiness for change  Nutritional Diagnosis:  Jeddo-3.2 Unintentional weight loss As related to chronic disease.  As evidenced by BMI 19.89 and dx of adult FTT.  Intervention: Discussion as noted above. Patient will continue to work on monitoring sodium intake and will put more emphasis on limiting/avoiding caffeine.  Education Materials given:  Marland Kitchen Dietary suggestions for Meniere's Disease . Low Sodium Diet Nutrition Therapy . Goals/ instructions  Learner/ who was taught:  . Patient  . Family member: Daughter  Level of understanding: Marland Kitchen Verbalizes/ demonstrates competency  Demonstrated degree of understanding via:   Teach back Learning barriers: . Hearing impairment  Willingness to learn/ readiness for change: . Eager, change in progress  Monitoring and Evaluation:  Dietary intake, exercise, caffeine and sodium intake, and body weight      follow up: prn

## 2017-01-28 ENCOUNTER — Telehealth: Payer: Self-pay | Admitting: Family Medicine

## 2017-01-28 NOTE — Telephone Encounter (Signed)
Order faxed and daughter Noralee Chars

## 2017-01-28 NOTE — Telephone Encounter (Signed)
Ok to do?-aa 

## 2017-01-28 NOTE — Telephone Encounter (Signed)
Pt daughter [name] called stating the gel mattress is not working for pt and she had spoke with [facility] and there is another mattress pt can get.  A Rx will need to be sent [facility] for a Alternating pressure pump and pad mattress.  This will need to be faxed to [facility] in [facility].  GY#[phone]/BL

## 2017-01-28 NOTE — Telephone Encounter (Signed)
ok 

## 2017-02-18 ENCOUNTER — Telehealth: Payer: Self-pay | Admitting: Family Medicine

## 2017-02-18 DIAGNOSIS — H8109 Meniere's disease, unspecified ear: Secondary | ICD-10-CM

## 2017-02-18 NOTE — Telephone Encounter (Signed)
Patients daughter Larene Beach) calling requesting a referral for her dad to see a otolaryngologist Dr. Shanna Cisco in [place] for meniere's disease. Thanks [facility]  Dr. Theora Gianotti  Phone # [phone], Fax # [phone]

## 2017-02-18 NOTE — Telephone Encounter (Signed)
OK 

## 2017-02-18 NOTE — Telephone Encounter (Signed)
Scott Crane would I order this as an ENT referral?

## 2017-02-19 NOTE — Telephone Encounter (Signed)
yes

## 2017-02-20 NOTE — Telephone Encounter (Signed)
Ordered yesterday.

## 2017-02-27 ENCOUNTER — Other Ambulatory Visit: Payer: Self-pay | Admitting: Family Medicine

## 2017-03-06 ENCOUNTER — Ambulatory Visit
Admission: RE | Admit: 2017-03-06 | Discharge: 2017-03-06 | Disposition: A | Discharge status: Home / Self Care | Payer: Medicare Other | Source: Ambulatory Visit | Attending: Family Medicine | Admitting: Family Medicine

## 2017-03-06 ENCOUNTER — Ambulatory Visit (INDEPENDENT_AMBULATORY_CARE_PROVIDER_SITE_OTHER): Payer: Medicare Other | Admitting: Family Medicine

## 2017-03-06 ENCOUNTER — Encounter: Payer: Self-pay | Admitting: Family Medicine

## 2017-03-06 VITALS — BP 90/52 | HR 67 | Temp 97.6°F | Resp 16 | Wt 113.0 lb

## 2017-03-06 DIAGNOSIS — R0600 Dyspnea, unspecified: Secondary | ICD-10-CM | POA: Diagnosis present

## 2017-03-06 DIAGNOSIS — M8580 Other specified disorders of bone density and structure, unspecified site: Secondary | ICD-10-CM | POA: Insufficient documentation

## 2017-03-06 DIAGNOSIS — G7102 Facioscapulohumeral muscular dystrophy: Secondary | ICD-10-CM

## 2017-03-06 DIAGNOSIS — G1221 Amyotrophic lateral sclerosis: Secondary | ICD-10-CM

## 2017-03-06 DIAGNOSIS — R0602 Shortness of breath: Secondary | ICD-10-CM | POA: Diagnosis not present

## 2017-03-06 DIAGNOSIS — M438X4 Other specified deforming dorsopathies, thoracic region: Secondary | ICD-10-CM | POA: Insufficient documentation

## 2017-03-06 DIAGNOSIS — G710 Muscular dystrophy: Secondary | ICD-10-CM

## 2017-03-06 NOTE — Progress Notes (Signed)
Patient: Scott Crane Male    DOB: Dec 26, 1929   83 y.o.   MRN: [id number] Visit Date: 03/06/2017  Today's Provider: Dortha Kern, PA   No chief complaint on file.  Subjective:    Patient is having trouble swallowing. Patient has seen Dr. Sullivan Lone for this in the past and had a Barium Swallow done 10/22/2016 showing okay. It was recommended that patient see speech pathology. Patient's symptoms have gotten worse since last swallow test was done. Patient is having trouble swallowing most of his medication. Patient's throat stays sore all the time.    Past Medical History:  Diagnosis Date  . Chickenpox   . Diabetes (HCC)   . Meniere's disease    Patient Active Problem List   Diagnosis Date Noted  . Hypercalcemia 12/31/2016  . Cervical disc disease with myelopathy 10/10/2016  . Hyperglycemia 08/29/2016  . Neuropathy 08/29/2016  . Failure to thrive (0-17) 08/29/2016  . Chronic osteoarthritis 11/16/2015  . Meniere disease 10/04/2015  . H/O varicella 03/18/2015  . Spondylosis, cervical, with myelopathy 11/07/2014  . 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 Paresthesia of both hands 06/29/2014   Past Surgical History:  Procedure Laterality Date  . CATARACT EXTRACTION    . MUSCLE BIOPSY     Family History  Problem Relation Age of Onset  . Stroke Mother   . Arthritis Sister    Allergies  Allergen Reactions  . Duloxetine Other (See Comments)    Other reaction(s): Other (See Comments) Joint pain Joint pain    Current Outpatient Prescriptions:  .  Acetaminophen (TYLENOL) 325 MG CAPS, Take 2 capsules by mouth 3 (three) times daily., Disp: , Rfl:  .  baclofen (LIORESAL) 10 MG tablet, Take 1 tablet by mouth 2 (two) times daily as needed., Disp: , Rfl: 0 .  cinacalcet (SENSIPAR) 30 MG tablet, Take by mouth., Disp: , Rfl:  .  Docusate Calcium  (STOOL SOFTENER PO), Take 1 tablet by mouth daily as needed., Disp: , Rfl:  .  gabapentin (NEURONTIN) 100 MG capsule, Take 1 capsule (100 mg total) by mouth 3 (three) times daily., Disp: 90 capsule, Rfl: 12 .  Magnesium Oxide 400 (240 Mg) MG TABS, take 1 tablet by mouth twice a day, Disp: 60 tablet, Rfl: 5 .  Magnesium Oxide 400 MG CAPS, magnesium oxide 400 mg tablet, Disp: , Rfl:  .  meclizine (ANTIVERT) 25 MG tablet, Take 1 tablet (25 mg total) by mouth 3 (three) times daily as needed for dizziness., Disp: 90 tablet, Rfl: 12 .  meclizine (ANTIVERT) 25 MG tablet, meclizine 25 mg tablet, Disp: , Rfl:  .  meloxicam (MOBIC) 7.5 MG tablet, Take 1 tablet (7.5 mg total) by mouth daily., Disp: 30 tablet, Rfl: 5 .  Misc Natural Products (LUTEIN 20 PO), Take by mouth., Disp: , Rfl:  .  multivitamin-lutein (OCUVITE-LUTEIN) CAPS capsule, Take 1 capsule by mouth daily., Disp: , Rfl:  .  Phenol (CHLORASEPTIC MT), Use as directed in the mouth or throat., Disp: , Rfl:  .  Polyethyl Glycol-Propyl Glycol 0.4-0.3 % SOLN, Apply to eye., Disp: , Rfl:  .  RA VITAMIN D-3 2000 units CAPS, , Disp: , Rfl: 0 .  traMADol (ULTRAM) 50 MG tablet, take 1 tablet by mouth every 6 hours if needed for pain, Disp: 90 tablet, Rfl: 5 .  triamterene-hydrochlorothiazide (MAXZIDE-25) 37.5-25 MG tablet, Take 1 tablet by mouth daily.,  Disp: 90 tablet, Rfl: 3  Review of Systems  Constitutional: Negative for appetite change, chills and fever.  Respiratory: Negative for chest tightness, shortness of breath and wheezing.   Cardiovascular: Negative for chest pain and palpitations.  Gastrointestinal: Negative for abdominal pain, nausea and vomiting.    Social History  Substance Use Topics  . Smoking status: Former Smoker    Years: 5.00    Types: Cigarettes  . Smokeless tobacco: Never Used  . Alcohol use No   Objective:   BP (!) 90/52 (BP Location: Left Arm, Patient Position: Sitting, Cuff Size: Normal)   Pulse 67   Temp 97.6 F  (36.4 C) (Oral)   Resp 16   Wt 113 lb (51.3 kg)   SpO2 98%   BMI 19.40 kg/m  Vitals:   03/06/17 1101  BP: (!) 90/52  Pulse: 67  Resp: 16  Temp: 97.6 F (36.4 C)  TempSrc: Oral  SpO2: 98%  Weight: 113 lb (51.3 kg)   Wt Readings from Last 3 Encounters:  03/06/17 113 lb (51.3 kg)  01/17/17 115 lb 14.4 oz (52.6 kg)  12/31/16 117 lb (53.1 kg)   Physical Exam  Constitutional: He is oriented to person, place, and time.  Cachetic.  Eyes: Conjunctivae are normal.  Neck: Neck supple.  Cardiovascular: Normal rate and regular rhythm.   Pulmonary/Chest: Breath sounds normal. He has no wheezes. He has no rales.  Abdominal: Soft. Bowel sounds are normal.  Musculoskeletal:  Total wasting away of shoulder, scapular, upper arm muscles bilaterally. Unable to flex either elbows (R>L) without using the left arm to help the right and swinging the arms or bending over to start flexion in both arms.  Neurological: He is oriented to person, place, and time.  Reflex Scores:      Tricep reflexes are 0 on the right side and 0 on the left side.      Bicep reflexes are 0 on the right side and 0 on the left side.      Patellar reflexes are 1+ on the right side and 1+ on the left side.     Assessment & Plan:     1. Shortness of breath Dyspnea worsening the past 2-3 weeks when he bends over. Hands numb with severe muscle wasting and weakness in upper arms, shoulders, scapula and upper torso. Some slight congestion but no significant cough or fever. Will get CXR to rule out congestion or cardiomegaly. No peripheral edema today. Denies any significant difficulty with swallowing today. Most concerned with shortness of breath. Spirometry showed moderate restriction which is probably due to upper chest/thoracic muscle weakness and inability to forcefully exhale.  - Spirometry with Graph - DG Chest 2 View  2. Brachial amyotrophic diplegia (HCC) Severe wasting away of upper arms, shoulders and upper back  musculature. States he has had several neurology referrals and evaluations, including muscle biopsies, without specific diagnosis confirmation. - Spirometry with Graph  3. Facioscapulohumeral muscular dystrophy (HCC) Progressively worsening of muscle wasting in shoulders, upper arms and scapular area.        Dortha Kern, PA  [facility]

## 2017-03-07 ENCOUNTER — Telehealth: Payer: Self-pay | Admitting: Family Medicine

## 2017-03-07 NOTE — Progress Notes (Signed)
Left message to return call for daughter Lynden Ang).

## 2017-03-07 NOTE — Telephone Encounter (Signed)
pt's daughter [name] called saying her dad was in yesterday and seen Maurine Minister.  He had an xray of his chest done and they have not heard back.  She is going out of town Monday and would like to know the results before she leaves.  Her call back is [phone]  thanks teri

## 2017-03-07 NOTE — Telephone Encounter (Signed)
See result note- lmtcb-aa

## 2017-03-10 NOTE — Telephone Encounter (Signed)
Left message to return call. Try again during the day tomorrow and if she calls back, try to get me to the phone.

## 2017-03-10 NOTE — Telephone Encounter (Signed)
Maurine Minister daughter called back from where you called her in results message. Thank you-aa

## 2017-03-10 NOTE — Telephone Encounter (Signed)
Pt's daughter Vickie left message on voicemail stating she was returning Ana's call. Please advise. Thanks TNP

## 2017-03-11 NOTE — Telephone Encounter (Signed)
Noted-aa 

## 2017-03-13 NOTE — Telephone Encounter (Signed)
See message in lab results section-aa

## 2017-03-13 NOTE — Progress Notes (Addendum)
Discussed case with Dr. Sullivan Lone and he agrees this may be weakening of respiratory muscles and a referral to a pulmonologist may be helpful. He suggested Dr. Erin Fulling.  Finally got a phone call connection with daughter Scott Crane) and advised her of Scott Crane condition and she wanted to discuss this with him before scheduling pulmonology referral. Also, told her that Dr. Sullivan Lone offered to see Scott Crane next week for follow up. She stated she would talk with him over the weekend as she felt he was doing OK.

## 2017-03-18 ENCOUNTER — Encounter: Payer: Self-pay | Admitting: Family Medicine

## 2017-03-20 ENCOUNTER — Telehealth: Payer: Self-pay | Admitting: Family Medicine

## 2017-03-20 DIAGNOSIS — R0602 Shortness of breath: Secondary | ICD-10-CM

## 2017-03-20 DIAGNOSIS — G7102 Facioscapulohumeral muscular dystrophy: Secondary | ICD-10-CM

## 2017-03-20 DIAGNOSIS — G1221 Amyotrophic lateral sclerosis: Secondary | ICD-10-CM

## 2017-03-20 NOTE — Telephone Encounter (Signed)
Daughter advised as below. Daughter states patient does have a nurse to come out when daughter is not in town and check on patient. Patient states the sore is healing and that the nurse will look at it and make sure everything is healing properly-aa

## 2017-03-20 NOTE — Telephone Encounter (Signed)
Order placed for pulmonology for SOB and muscular dystrophy. Per Dr Sullivan Lone patient needs to be seen with someone to look at sore or referral for home health to come out and look at this if patient can not get in.  LMTCB for patient's daughter, Jani Files

## 2017-03-20 NOTE — Telephone Encounter (Signed)
Pt daughter [name] called to request a referral to see a pulmonary specialist.    Pt daughter also states pt has a bed sore on his bottom.  She is requesting a antibiotic to help with this.  [facility] [employer].  [phone]

## 2017-03-20 NOTE — Telephone Encounter (Signed)
Home health to evaluate bed sore--antibiotic if needed but usually needs constant body adjustment to get pt off of sore. What is pulmonary for?

## 2017-03-21 IMAGING — RF DG SWALLOWING FUNCTION
12 of 14 series · 13 of 24 positions shown · non-contrast
Comparison: None in PACs

CLINICAL DATA: Evaluate for for oropharyngeal dysphagia.

EXAM:
MODIFIED BARIUM SWALLOW
TECHNIQUE: Different consistencies of barium were administered orally to the
patient by the Speech Pathologist. Imaging of the pharynx was
performed in the lateral projection.
FLUOROSCOPY TIME:  Fluoroscopy Time:  1 minutes, 42 seconds
The PA and 14 50 Number of Acquired Spot Images: 15 video clips

[Series 1: run · 1 of 111 frames shown (1 of 12)]
[frame 13/111]
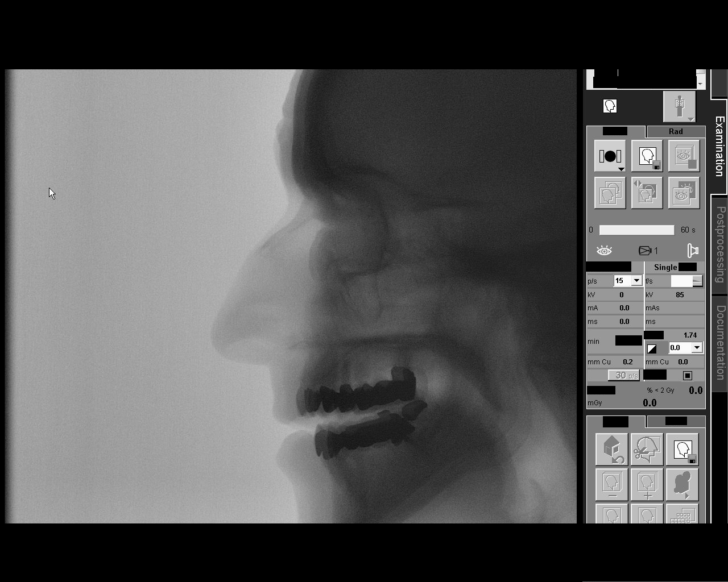

[Series 2: run · 1 of 271 frames shown (2 of 12)]
[frame 63/271]
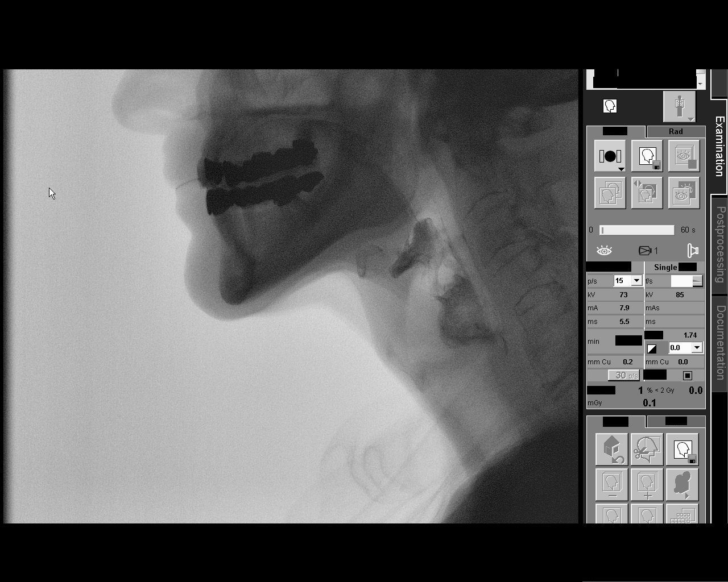

[Series 3: run · 1 of 98 frames shown (3 of 12)]
[frame 50/98]
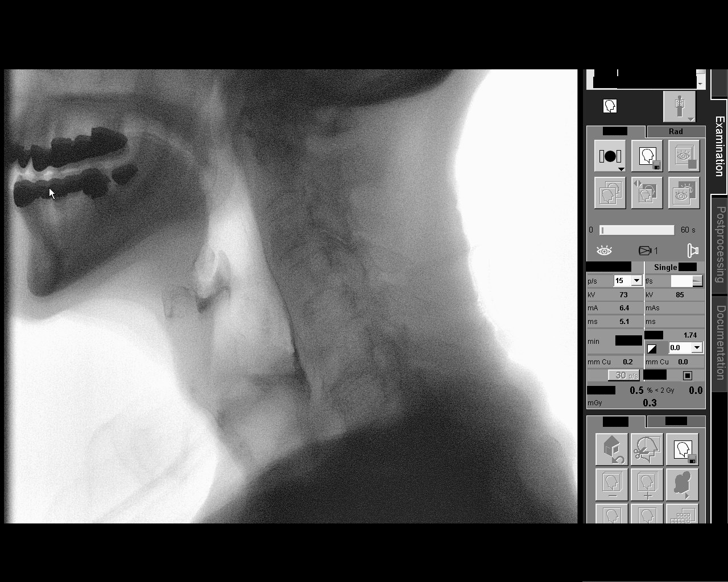

[Series 4: run · 1 of 291 frames shown (4 of 12)]
[frame 146/291]
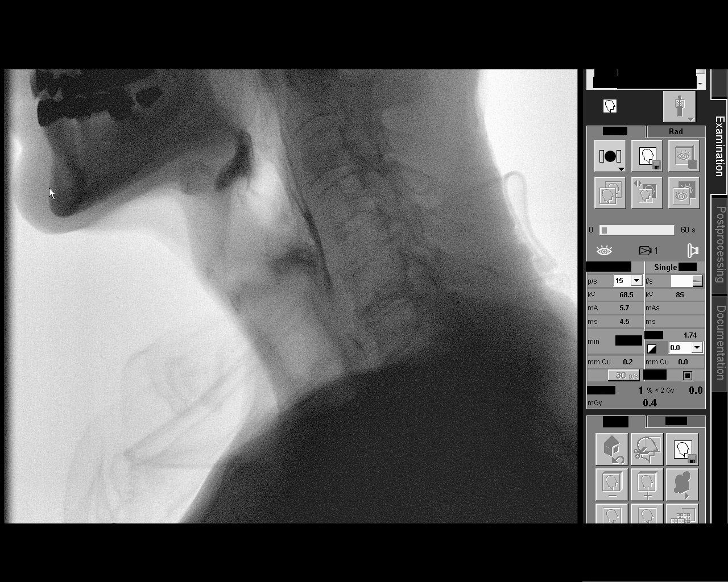

[Series 5: run · 1 of 476 frames shown (5 of 12)]
[frame 405/476]
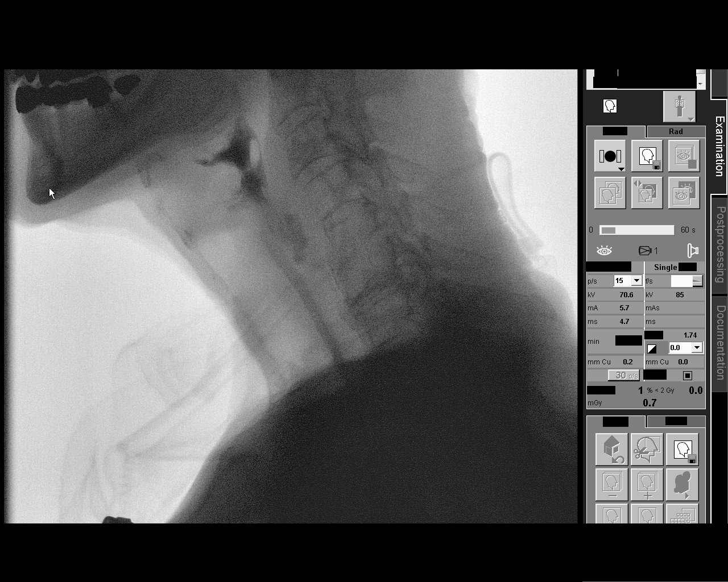

[Series 7: run · 1 of 365 frames shown (6 of 12)]
[frame 15/365]
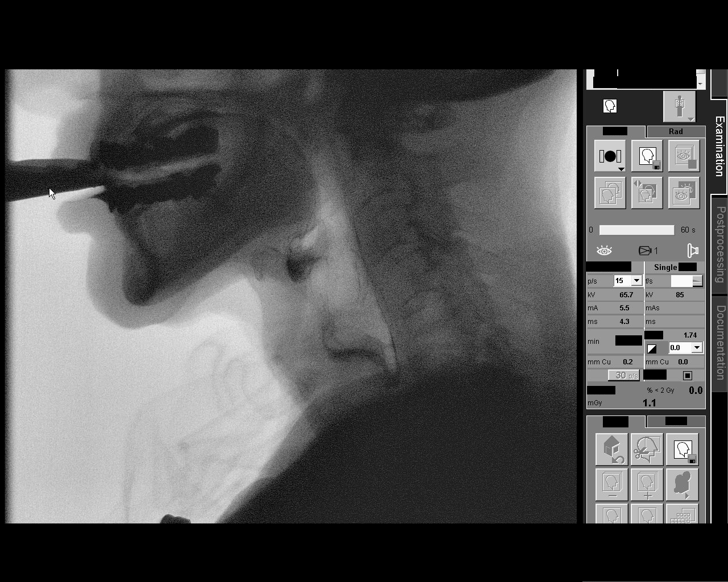

[Series 8: run · 2 of 316 frames shown (7 of 12)]
[frame 48/316]
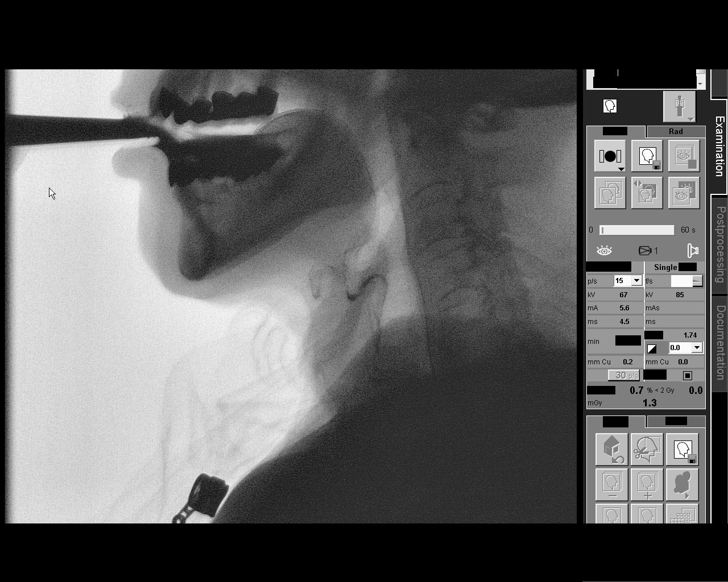
[frame 269/316]
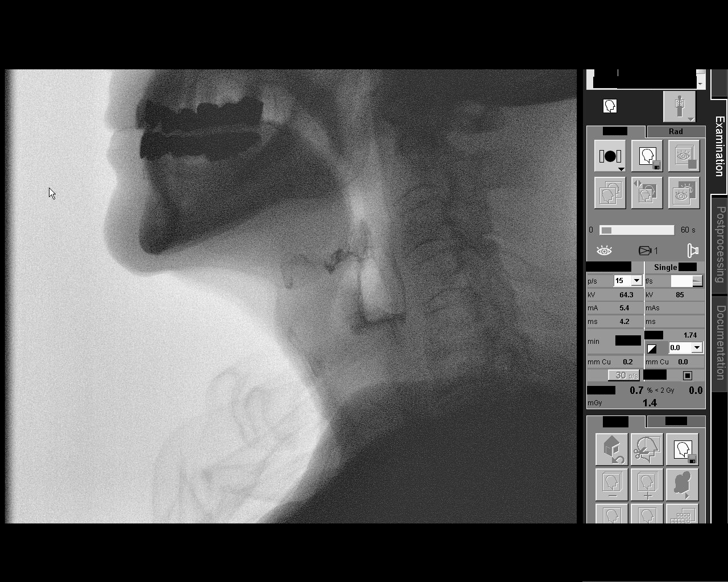

[Series 10: run · 1 of 60 frames shown (8 of 12)]
[frame 10/60]
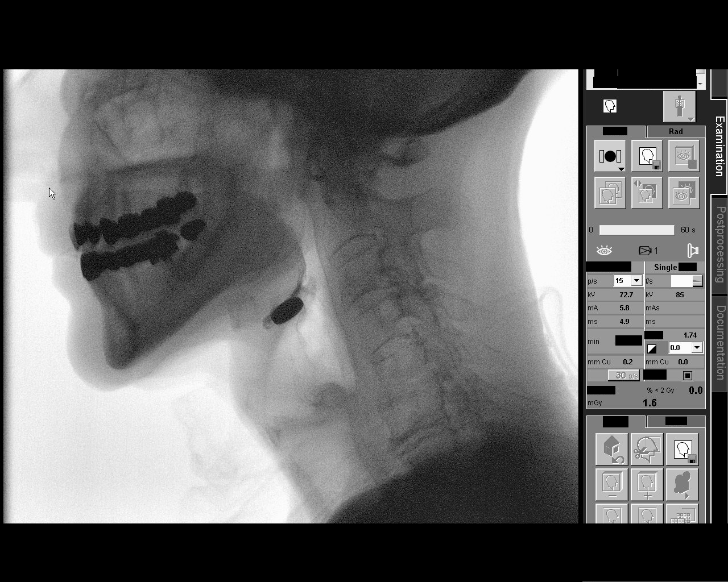

[Series 11: run · 1 of 259 frames shown (9 of 12)]
[frame 127/259]
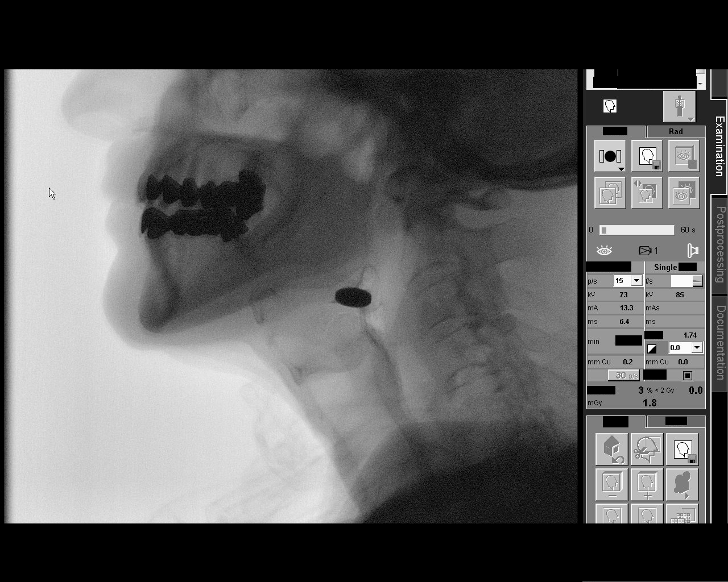

[Series 12: run · 1 of 182 frames shown (10 of 12)]
[frame 92/182]
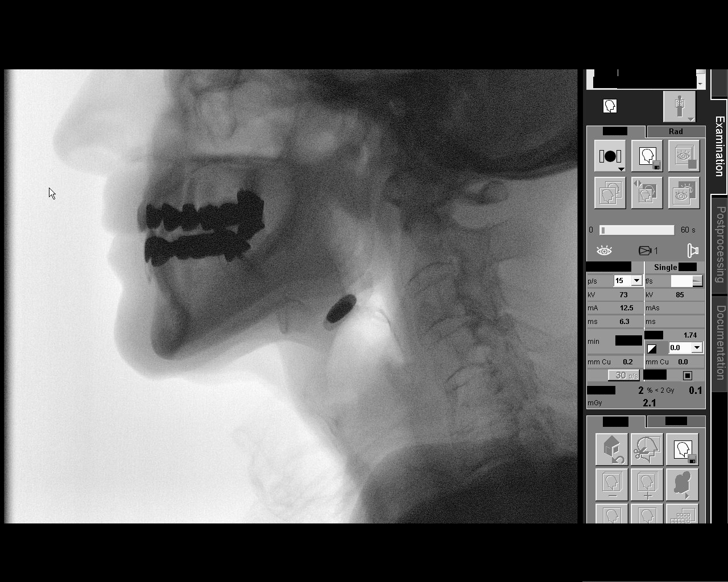

[Series 13: run · 1 of 40 frames shown (11 of 12)]
[frame 21/40]
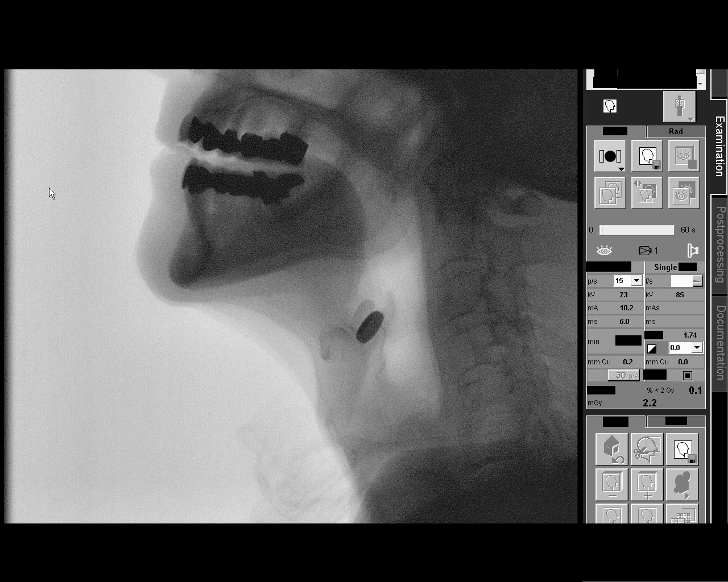

[Series 14: run · 1 of 59 frames shown (12 of 12)]
[frame 55/59]
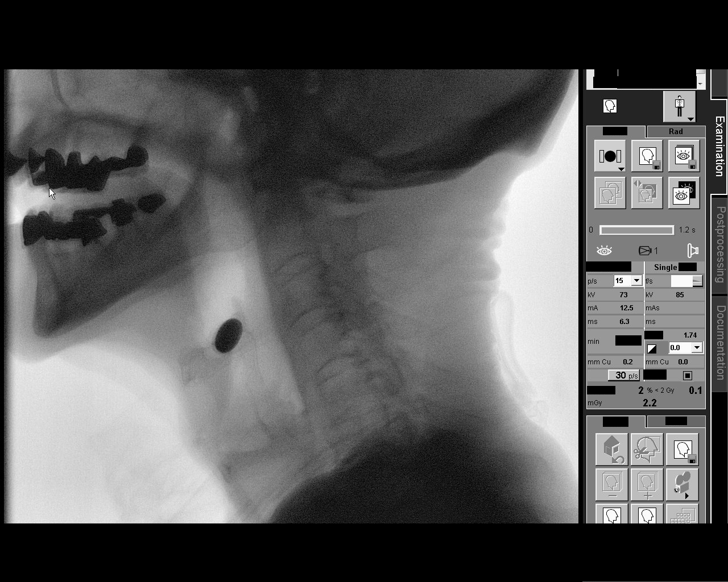

[13 of 24 positions shown; findings below may reference images not displayed]

FINDINGS: Thin liquid- within normal limits. There was a single episode of a
flash of laryngeal penetration which prompted a cough reflex.

Nectar thick liquid- within normal limits

Chibuya?Solipa within normal limits

Chibuya?Adlaho with cracker- within normal limits

Barium tablet - the barium tablet was promptly swallowed but lodged
in the vallecula and hung there and could not be dislodged with
additional sips of water. The patient had no respiratory distress
during this time.
IMPRESSION: Reasonable preservation of swallowing function with no laryngeal
penetration of the barium. The patient does have difficulty with
large pills by history and the barium tablet did in fact lodge in
the vallecula.

Please refer to the Speech Pathologists report for complete details
and recommendations.

## 2017-03-24 ENCOUNTER — Telehealth: Payer: Self-pay | Admitting: Emergency Medicine

## 2017-03-24 NOTE — Telephone Encounter (Signed)
Error

## 2017-03-25 ENCOUNTER — Ambulatory Visit (INDEPENDENT_AMBULATORY_CARE_PROVIDER_SITE_OTHER): Payer: Medicare Other | Admitting: Family Medicine

## 2017-03-25 ENCOUNTER — Telehealth: Payer: Self-pay | Admitting: Family Medicine

## 2017-03-25 ENCOUNTER — Encounter: Payer: Self-pay | Admitting: Family Medicine

## 2017-03-25 VITALS — BP 102/58 | HR 47 | Temp 97.8°F | Wt 115.0 lb

## 2017-03-25 DIAGNOSIS — L89321 Pressure ulcer of left buttock, stage 1: Secondary | ICD-10-CM | POA: Diagnosis not present

## 2017-03-25 NOTE — Telephone Encounter (Signed)
Spoke with Vicki-pt's daughter. Advised of message below. She states [facility] does not carry the patches in store they have to be ordered online. Directed patient how to order them online while on the phone with her. Also advised her to contact the local owned pharmacies like [facility], [place], and [address] before [employer].

## 2017-03-25 NOTE — Telephone Encounter (Signed)
Pt's daughter Larene Beach stated that she went to get the patches for bedsores but they are 20$ per patch and they have to be ordered. Vickie request that she get an Rx for the patches or Rx for cream so she can start treating this today if possible. Please advise. Thanks TNP

## 2017-03-25 NOTE — Progress Notes (Signed)
Patient: Scott Crane Male    DOB: 01-Apr-1930   83 y.o.   MRN: [id number] Visit Date: 03/25/2017  Today's Provider: Dortha Kern, PA   Chief Complaint  Patient presents with  . Skin Ulcer   Subjective:    HPI Skin Ulcer on Left Buttocks:  This is a new problem. Episode onset: 5-6 months. The problem has been gradually worsening. Associated symptoms comments: Itching and soreness. Exacerbated by: sitting. Treatments tried: A&D and donut pillow. The treatment provided no relief.   Patient Active Problem List   Diagnosis Date Noted  . Hypercalcemia 12/31/2016  . Cervical disc disease with myelopathy 10/10/2016  . Hyperglycemia 08/29/2016  . Neuropathy 08/29/2016  . Failure to thrive (0-17) 08/29/2016  . Chronic osteoarthritis 11/16/2015  . Meniere disease 10/04/2015  . H/O varicella 03/18/2015  . Spondylosis, cervical, with myelopathy 11/07/2014  . 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 Paresthesia of both hands 06/29/2014   Past Surgical History:  Procedure Laterality Date  . CATARACT EXTRACTION    . MUSCLE BIOPSY     Family History  Problem Relation Age of Onset  . Stroke Mother   . Arthritis Sister    Allergies  Allergen Reactions  . Duloxetine Other (See Comments)    Other reaction(s): Other (See Comments) Joint pain Joint pain   Previous Medications   ACETAMINOPHEN (TYLENOL) 325 MG CAPS    Take 2 capsules by mouth 3 (three) times daily.   BACLOFEN (LIORESAL) 10 MG TABLET    Take 1 tablet by mouth 2 (two) times daily as needed.   CINACALCET (SENSIPAR) 30 MG TABLET    Take by mouth.   DOCUSATE CALCIUM (STOOL SOFTENER PO)    Take 1 tablet by mouth daily as needed.   MAGNESIUM OXIDE 400 (240 MG) MG TABS    take 1 tablet by mouth twice a day   MECLIZINE (ANTIVERT) 25 MG TABLET    meclizine 25 mg tablet   MELOXICAM (MOBIC) 7.5 MG TABLET     Take 1 tablet (7.5 mg total) by mouth daily.   MISC NATURAL PRODUCTS (LUTEIN 20 PO)    Take by mouth.   PHENOL (CHLORASEPTIC MT)    Use as directed in the mouth or throat.   POLYETHYL GLYCOL-PROPYL GLYCOL 0.4-0.3 % SOLN    Apply to eye.   RA VITAMIN D-3 2000 UNITS CAPS       TRAMADOL (ULTRAM) 50 MG TABLET    take 1 tablet by mouth every 6 hours if needed for pain   TRIAMTERENE-HYDROCHLOROTHIAZIDE (MAXZIDE-25) 37.5-25 MG TABLET    Take 1 tablet by mouth daily.    Review of Systems  Constitutional: Negative.   Respiratory: Negative.   Cardiovascular: Negative.   Skin:       Bed sore on buttocks     Social History  Substance Use Topics  . Smoking status: Former Smoker    Years: 5.00    Types: Cigarettes  . Smokeless tobacco: Never Used  . Alcohol use No   Objective:   BP (!) 102/58 (BP Location: Right Arm, Patient Position: Sitting, Cuff Size: Normal)   Pulse (!) 47   Temp 97.8 F (36.6 C) (Oral)   Wt 115 lb (52.2 kg)   SpO2 98%   BMI 19.74 kg/m   Physical Exam  Constitutional:  Cachetic with severe muscle wasting.  Skin:  No open sores. Approximate 4 cm erythematous dry  patch over the left ischial spine.       Assessment & Plan:     1. Decubitus ulcer of left buttock, stage 1 Onset intermittently over the past 4-5 months with severe muscle wasting disease and inability to shift weight off the left ischial spine frequent enough. No open ulceration. Has redness and soreness in this area. Recommend egg crate mattress, gel pillow for chair and Duoderm Patches (change once a week - keep area clean and dry). Home health assistance is available and can help with changing patches. Recheck prn.

## 2017-03-25 NOTE — Telephone Encounter (Signed)
LMTCB. Per Robynn Pane has patches that come 5 per pack for approximately $10-$15. Patient can use a 2.5 in X 2.5 in DuoDerm or take the 6 X 7 and cut for two applications. DuoDerm only has to be changed weekly.   Only other option is a referral to Home Health for wound care.

## 2017-03-28 ENCOUNTER — Telehealth: Payer: Self-pay | Admitting: Family Medicine

## 2017-03-28 DIAGNOSIS — L89329 Pressure ulcer of left buttock, unspecified stage: Secondary | ICD-10-CM

## 2017-03-28 NOTE — Telephone Encounter (Signed)
Send order to [facility] to have evaluation/assessment of home health needs. Early signs of a pressure sore suspected.

## 2017-03-28 NOTE — Telephone Encounter (Signed)
Pt daughter Larene Beach states she is going out of town and pt is needing skilled nursing care due to having a bed sore.  Pt daughter is requesting an order sent [facility] to see if they can have a nurse come to the home to treat the bed sore.  WN#[phone]/FG

## 2017-03-31 NOTE — Telephone Encounter (Signed)
Home Health referral in [facility] for early signs of pressure sore suspected.

## 2017-04-03 ENCOUNTER — Telehealth: Payer: Self-pay | Admitting: Family Medicine

## 2017-04-03 NOTE — Telephone Encounter (Signed)
Jeffe with [name] is requesting a call back to pt plan of care.  He is asking for a call back today or tomorrow if possible.   CB#[phone]/MW

## 2017-04-04 NOTE — Telephone Encounter (Signed)
Spoke with Verdon Cummins with [name]. He came out to see patient for evaluation and has him set up with Home health. Just wanted to give updates for now such as it seems that swallowing issue for patient is getting worse, he had last swallow study and speech therapy evaluation in December 2017 and Verdon Cummins vwill go ahead and get another one ordered to evaluate if this is worse. Due to swallowing issue patient is taking acetaminophen 500 mg capsule 1 capsule up to 3 times daily instead of taking 350 mg 2 daily, also can not swallow Lutein so he is taking Preservision Areds 2-2 daily, and he is not using Chloraseptic for his mouth/throat. Verdon Cummins said they will take care of the bed sore patient has which is the reason for the referral to begin with. Orders will be faxed for everything over to [place] for signature. Just an update and no phone call back is requered-aa

## 2017-04-09 ENCOUNTER — Telehealth: Payer: Self-pay | Admitting: Family Medicine

## 2017-04-09 NOTE — Telephone Encounter (Signed)
Scott Crane with [name] states pt has refused physical therapy.  CB#[phone]/MW

## 2017-04-09 NOTE — Telephone Encounter (Signed)
Kim with [name] called to say patient declined having speech therapy.   Thanks, [employer]

## 2017-04-10 NOTE — Telephone Encounter (Signed)
Kathie Rhodes with [name] called discharging pt of their services.  FYI...  teri

## 2017-04-10 NOTE — Telephone Encounter (Signed)
See below-aa 

## 2017-04-14 ENCOUNTER — Other Ambulatory Visit: Payer: Self-pay | Admitting: Specialist

## 2017-04-14 DIAGNOSIS — R0602 Shortness of breath: Secondary | ICD-10-CM

## 2017-04-16 ENCOUNTER — Other Ambulatory Visit: Payer: Self-pay | Admitting: Family Medicine

## 2017-04-16 DIAGNOSIS — R42 Dizziness and giddiness: Secondary | ICD-10-CM

## 2017-04-16 DIAGNOSIS — H8109 Meniere's disease, unspecified ear: Secondary | ICD-10-CM

## 2017-04-16 NOTE — Telephone Encounter (Signed)
Can you review for Dr Gilbert-aa 

## 2017-04-17 ENCOUNTER — Telehealth: Payer: Self-pay | Admitting: Family Medicine

## 2017-04-17 ENCOUNTER — Other Ambulatory Visit: Payer: Self-pay | Admitting: Family Medicine

## 2017-04-17 DIAGNOSIS — R42 Dizziness and giddiness: Secondary | ICD-10-CM

## 2017-04-17 DIAGNOSIS — H8109 Meniere's disease, unspecified ear: Secondary | ICD-10-CM

## 2017-04-17 MED ORDER — MECLIZINE HCL 25 MG PO TABS: 25.0000 mg | ORAL_TABLET | Freq: Three times a day (TID) | ORAL | 1 refills | Status: DC

## 2017-04-17 NOTE — Telephone Encounter (Signed)
Scott Crane with [name] states pt has decided not to cancel nursing visits.  Pt was seen today and will be seen next week.    Pt is using a couple of over the counter ointments.  Pt is using  A&D in the groin area/perineal area and hydrocortisone cream on both ankles for a rash.

## 2017-04-18 ENCOUNTER — Ambulatory Visit
Admission: RE | Admit: 2017-04-18 | Discharge: 2017-04-18 | Disposition: A | Discharge status: Home / Self Care | Payer: Medicare Other | Source: Ambulatory Visit | Attending: Specialist | Admitting: Specialist

## 2017-04-18 ENCOUNTER — Ambulatory Visit: Payer: Medicare Other

## 2017-04-18 DIAGNOSIS — J986 Disorders of diaphragm: Secondary | ICD-10-CM | POA: Insufficient documentation

## 2017-04-18 DIAGNOSIS — R0602 Shortness of breath: Secondary | ICD-10-CM | POA: Diagnosis present

## 2017-04-18 NOTE — Telephone Encounter (Signed)
Please review-aa 

## 2017-04-21 NOTE — Telephone Encounter (Signed)
ok 

## 2017-04-25 ENCOUNTER — Telehealth: Payer: Self-pay | Admitting: Family Medicine

## 2017-04-25 ENCOUNTER — Ambulatory Visit: Payer: Self-pay | Admitting: Pulmonary Disease

## 2017-04-25 NOTE — Telephone Encounter (Signed)
Scott Crane with [facility] called to advise pt was discharged from home health services yesterday with goals met.   Pt does has still have redness around the wound on his bottom.    CB#[phone]/MW

## 2017-04-25 NOTE — Telephone Encounter (Signed)
[  please review-aa 

## 2017-04-26 NOTE — Telephone Encounter (Signed)
thx

## 2017-04-30 ENCOUNTER — Telehealth: Payer: Self-pay | Admitting: Family Medicine

## 2017-04-30 NOTE — Telephone Encounter (Signed)
Health care power of attorney Scott Crane had came into hospice care requesting Palliative Services. Scott Crane states that she will be faxing over form to our office for Dr. Sullivan Lone to review. KW

## 2017-04-30 NOTE — Telephone Encounter (Signed)
FYI below-aa 

## 2017-04-30 NOTE — Telephone Encounter (Signed)
ok 

## 2017-05-01 ENCOUNTER — Ambulatory Visit: Payer: Self-pay | Admitting: Internal Medicine

## 2017-05-08 ENCOUNTER — Emergency Department: Payer: Medicare Other

## 2017-05-08 ENCOUNTER — Emergency Department
Admission: EM | Admit: 2017-05-08 | Discharge: 2017-05-08 | Disposition: A | Discharge status: Home / Self Care | Payer: Medicare Other | Attending: Emergency Medicine | Admitting: Emergency Medicine

## 2017-05-08 ENCOUNTER — Encounter: Payer: Self-pay | Admitting: Emergency Medicine

## 2017-05-08 DIAGNOSIS — E1140 Type 2 diabetes mellitus with diabetic neuropathy, unspecified: Secondary | ICD-10-CM | POA: Insufficient documentation

## 2017-05-08 DIAGNOSIS — Z87891 Personal history of nicotine dependence: Secondary | ICD-10-CM | POA: Diagnosis not present

## 2017-05-08 DIAGNOSIS — R410 Disorientation, unspecified: Secondary | ICD-10-CM | POA: Diagnosis not present

## 2017-05-08 DIAGNOSIS — R531 Weakness: Secondary | ICD-10-CM

## 2017-05-08 DIAGNOSIS — Z79899 Other long term (current) drug therapy: Secondary | ICD-10-CM | POA: Diagnosis not present

## 2017-05-08 DIAGNOSIS — R4182 Altered mental status, unspecified: Secondary | ICD-10-CM | POA: Diagnosis present

## 2017-05-08 LAB — CBC
HEMATOCRIT: 35.6 % — AB (ref 40.0–52.0)
Hemoglobin: 12.4 g/dL — ABNORMAL LOW (ref 13.0–18.0)
MCH: 32.4 pg (ref 26.0–34.0)
MCHC: 34.8 g/dL (ref 32.0–36.0)
MCV: 93.3 fL (ref 80.0–100.0)
Platelets: 207 10*3/uL (ref 150–440)
RBC: 3.82 MIL/uL — AB (ref 4.40–5.90)
RDW: 12.9 % (ref 11.5–14.5)
WBC: 4.1 10*3/uL (ref 3.8–10.6)

## 2017-05-08 LAB — COMPREHENSIVE METABOLIC PANEL
ALBUMIN: 4.1 g/dL (ref 3.5–5.0)
ALT: 14 U/L — ABNORMAL LOW (ref 17–63)
AST: 21 U/L (ref 15–41)
Alkaline Phosphatase: 75 U/L (ref 38–126)
Anion gap: 9 (ref 5–15)
BILIRUBIN TOTAL: 0.8 mg/dL (ref 0.3–1.2)
BUN: 33 mg/dL — AB (ref 6–20)
CHLORIDE: 95 mmol/L — AB (ref 101–111)
CO2: 33 mmol/L — ABNORMAL HIGH (ref 22–32)
Calcium: 8.8 mg/dL — ABNORMAL LOW (ref 8.9–10.3)
Creatinine, Ser: 0.93 mg/dL (ref 0.61–1.24)
GFR calc Af Amer: >60 mL/min (ref >60)
GFR calc non Af Amer: >60 mL/min (ref >60)
GLUCOSE: 109 mg/dL — AB (ref 65–99)
POTASSIUM: 4.3 mmol/L (ref 3.5–5.1)
Sodium: 137 mmol/L (ref 135–145)
TOTAL PROTEIN: 7.1 g/dL (ref 6.5–8.1)

## 2017-05-08 LAB — TROPONIN I: Troponin I: <0.03 ng/mL (ref <0.03)

## 2017-05-08 LAB — PHOSPHORUS: Phosphorus: 3.6 mg/dL (ref 2.5–4.6)

## 2017-05-08 LAB — MAGNESIUM: Magnesium: 2.1 mg/dL (ref 1.7–2.4)

## 2017-05-08 MED ORDER — LORAZEPAM 2 MG/ML IJ SOLN
0.5000 mg | Freq: Once | INTRAMUSCULAR | Status: AC
  Administered 2017-05-08: 0.5 mg via INTRAVENOUS

## 2017-05-08 MED ORDER — LORAZEPAM 0.5 MG PO TABS: 0.5000 mg | ORAL_TABLET | Freq: Three times a day (TID) | ORAL | 0 refills | Status: DC | PRN

## 2017-05-08 MED ORDER — LORAZEPAM 2 MG/ML IJ SOLN
INTRAMUSCULAR | Status: AC
  Filled 2017-05-08: qty 1

## 2017-05-08 MED ORDER — LORAZEPAM 0.5 MG PO TABS
0.5000 mg | ORAL_TABLET | Freq: Once | ORAL | Status: AC
  Administered 2017-05-08: 0.5 mg via ORAL
  Filled 2017-05-08: qty 1

## 2017-05-08 NOTE — ED Notes (Signed)
Patient transported to CT 

## 2017-05-08 NOTE — ED Triage Notes (Signed)
Pt. Stated while performing ADL's with daughter assistance at home pt. Stated he started to feel numb in upper extremities.  Pt. Has meineirs disease.  Daughter stated same kind of symptoms started last night around same time.  Pt. Has hx of meiners disease.

## 2017-05-08 NOTE — ED Notes (Signed)
Pt. Returned from MRI and stated "I can move my hand better now"

## 2017-05-08 NOTE — ED Notes (Signed)
Pt. Going home with daughter. 

## 2017-05-08 NOTE — ED Notes (Signed)
Pt. States he can not swallow apple sauce or fluid at this time.

## 2017-05-08 NOTE — Discharge Instructions (Signed)
Please follow up with your primary care physician.

## 2017-05-08 NOTE — ED Notes (Signed)
Pt. Returned from MRI, pt. Resting in bed.

## 2017-05-08 NOTE — ED Notes (Signed)
Pt. Able to eat 4 oz of apple sauce and drink 6 oz of water with no difficulty.

## 2017-05-08 NOTE — ED Notes (Signed)
Patient transported to MRI 

## 2017-05-08 NOTE — ED Provider Notes (Signed)
[facility] Emergency Department Provider Note   ____________________________________________   First MD Initiated Contact with Patient 05/08/17 0101     (approximate)  I have reviewed the triage vital signs and the nursing notes.   HISTORY  Chief Complaint Altered Mental Status    HPI Scott Crane is a 83 y.o. male who comes into the hospital today by EMS with the statement of being nonresponsive. They report that this is the second night in a row that this has occurred. The patient's daughter had taken him to the bathroom and they report that he has been losing his ADLs.He states that he felt paralyzed and numb. The patient's daughter assisted into the living room and he sat there and seemed to become unresponsive. It lasted approximately 10 minutes. EMS and the patient's daughter states that they're concerned that this may be due to anxiety as the patient's caretaker is going away on vacation tomorrow.he patient has some joint pains and he reports some muscle loss. He denies any chest pain, headache. He states that his legs and his back felt paralyzed and he felt like he is going to pass out. He reports that he has numbness in his upper back shoulders and neck but that is not uncommon given his history of pinched nerves in his cervical spine, scoliosis and muscle loss. He is here today for evaluation.   Past Medical History:  Diagnosis Date  . Chickenpox   . Diabetes (HCC)   . Meniere's disease     Patient Active Problem List   Diagnosis Date Noted  . Hypercalcemia 12/31/2016  . Cervical disc disease with myelopathy 10/10/2016  . Hyperglycemia 08/29/2016  . Neuropathy 08/29/2016  . Failure to thrive (0-17) 08/29/2016  . Chronic osteoarthritis 11/16/2015  . Meniere disease 10/04/2015  . H/O varicella 03/18/2015  . Spondylosis, cervical, with myelopathy 11/07/2014  . 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 Atrophy,  facioscapulohumeral (HCC) 06/29/2014  . 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 Paresthesia of both hands 06/29/2014    Past Surgical History:  Procedure Laterality Date  . CATARACT EXTRACTION    . MUSCLE BIOPSY      Prior to Admission medications   Medication Sig Start Date End Date Taking? Authorizing Provider  acetaminophen (TYLENOL) 500 MG tablet Take 500 mg by mouth every 8 (eight) hours as needed for mild pain.   Yes [provider]  baclofen (LIORESAL) 10 MG tablet Take 1 tablet by mouth 3 (three) times daily.  12/19/16  Yes [provider]  cinacalcet (SENSIPAR) 30 MG tablet Take 30 mg by mouth 2 (two) times daily with a meal.  01/15/17  Yes [provider]  Magnesium Oxide 400 (240 Mg) MG TABS take 1 tablet by mouth twice a day 10/07/16  Yes Maple Hudson., MD  meclizine (ANTIVERT) 25 MG tablet Take 25 mg by mouth 3 (three) times daily as needed for dizziness.   Yes [provider]  meloxicam (MOBIC) 7.5 MG tablet Take 1 tablet (7.5 mg total) by mouth daily. 04/04/16  Yes Maple Hudson., MD  Misc Natural Products (LUTEIN 20 PO) Take 1 capsule by mouth daily.    Yes [provider]  Polyethyl Glycol-Propyl Glycol 0.4-0.3 % SOLN Apply 1 drop to eye as needed (dry eyes).    Yes [provider]  RA VITAMIN D-3 2000 units CAPS Take 2,000 Units by mouth daily.  01/13/17  Yes [provider]  traMADol Janean Sark)  50 MG tablet take 1 tablet by mouth every 6 hours if needed for pain 02/27/17  Yes Maple Hudson., MD  triamterene-hydrochlorothiazide (MAXZIDE-25) 37.5-25 MG tablet Take 1 tablet by mouth daily. 04/04/16  Yes Maple Hudson., MD  LORazepam (ATIVAN) 0.5 MG tablet Take 1 tablet (0.5 mg total) by mouth every 8 (eight) hours as needed for anxiety. 05/08/17 05/08/18  Rebecka Apley, MD    Allergies Duloxetine  Family History  Problem Relation Age of Onset  . Stroke Mother   .  Arthritis Sister     Social History Social History  Substance Use Topics  . Smoking status: Former Smoker    Years: 5.00    Types: Cigarettes  . Smokeless tobacco: Never Used  . Alcohol use No    Review of Systems  Constitutional: No fever/chills Eyes: No visual changes. ENT: No sore throat. Cardiovascular: Denies chest pain. Respiratory: Denies shortness of breath. Gastrointestinal: No abdominal pain.  No nausea, no vomiting.  No diarrhea.  No constipation. Genitourinary: Negative for dysuria. Musculoskeletal: joint pains. Skin: Negative for rash. Neurological: weakness in extremities   ____________________________________________   PHYSICAL EXAM:  VITAL SIGNS: ED Triage Vitals  Enc Vitals Group     BP 05/08/17 0118 132/68     Pulse Rate 05/08/17 0118 80     Resp 05/08/17 0118 (!) 22     Temp 05/08/17 0118 97.8 F (36.6 C)     Temp Source 05/08/17 0118 Oral     SpO2 05/08/17 0118 100 %     Weight 05/08/17 0118 110 lb (49.9 kg)     Height 05/08/17 0118 5\' 5"  (1.651 m)     Head Circumference --      Peak Flow --      Pain Score 05/08/17 0113 5     Pain Loc --      Pain Edu? --      Excl. in GC? --     Constitutional: Alert and oriented. Ill appearing and in mild distress. Eyes: Conjunctivae are normal. PERRL. EOMI. Head: Atraumatic. Nose: No congestion/rhinnorhea. Mouth/Throat: Mucous membranes are moist.  Oropharynx non-erythematous. Cardiovascular: Normal rate, regular rhythm. Grossly normal heart sounds.  Good peripheral circulation. Respiratory: Normal respiratory effort.  No retractions. Lungs CTAB. Gastrointestinal: Soft and nontender. No distention. positive bowel sounds Musculoskeletal: No lower extremity tenderness nor edema.   Neurologic:  Normal speech and language. Cranial nerves II through XII are grossly intact, patient with some decreased grip bilaterally in upper extremities and strength at 3 out of 6 in bilateral lower extremities,  sensation is intact at this time. Skin:  Skin is warm, dry and intact. No rash noted. Psychiatric: Mood and affect are normal. Speech and behavior are normal.  ____________________________________________   LABS (all labs ordered are listed, but only abnormal results are displayed)  Labs Reviewed  CBC - Abnormal; Notable for the following:       Result Value   RBC 3.82 (*)    Hemoglobin 12.4 (*)    HCT 35.6 (*)    All other components within normal limits  COMPREHENSIVE METABOLIC PANEL - Abnormal; Notable for the following:    Chloride 95 (*)    CO2 33 (*)    Glucose, Bld 109 (*)    BUN 33 (*)    Calcium 8.8 (*)    ALT 14 (*)    All other components within normal limits  TROPONIN I  MAGNESIUM  PHOSPHORUS   ____________________________________________  EKG  ED ECG REPORT I, Rebecka Apley, the attending physician, personally viewed and interpreted this ECG.   Date: 05/08/2017  EKG Time: 114  Rate: 77  Rhythm: normal sinus rhythm  Axis: normal  Intervals:none  ST&T Change: none  ____________________________________________  RADIOLOGY  Ct Head Wo Contrast  Result Date: 05/08/2017 CLINICAL DATA:  Muscle weakness, numbness in the extremities EXAM: CT HEAD WITHOUT CONTRAST TECHNIQUE: Contiguous axial images were obtained from the base of the skull through the vertex without intravenous contrast. COMPARISON:  04/02/2008 FINDINGS: Brain: No acute territorial infarction, hemorrhage or intracranial mass is seen. Scattered periventricular white matter hypodensity consistent with small vessel ischemic change. Normal ventricle size. Vascular: No hyperdense vessels.  Carotid artery calcification. Skull: No fracture or suspicious bone lesion Sinuses/Orbits: Mild mucosal thickening in the maxillary sinuses. No acute orbital abnormality. Other: None IMPRESSION: 1. No CT evidence for acute intracranial abnormality. 2. Mild small vessel ischemic changes of the white matter  Electronically Signed   By: Jasmine Pang M.D.   On: 05/08/2017 02:11   Mr Brain Wo Contrast  Result Date: 05/08/2017 CLINICAL DATA:  Recurrent extremity numbness, speech difficulties beginning last night. History of Meniere's disease. EXAM: MRI HEAD WITHOUT CONTRAST TECHNIQUE: Multiplanar, multiecho pulse sequences of the brain and surrounding structures were obtained without intravenous contrast. COMPARISON:  CT HEAD May 08, 2017 and MRI of the head April 02, 2008 and MRI of the cervical spine September 05, 2014 FINDINGS: Multiple sequences are moderately motion degraded. BRAIN: No reduced diffusion to suggest acute ischemia. No susceptibility artifact to suggest hemorrhage. The ventricles and sulci are normal for patient's age. Patchy to confluent supratentorial white matter FLAIR T2 hyperintensities. No suspicious parenchymal signal, mass or mass effect. No abnormal extra-axial fluid collections. VASCULAR: Normal major intracranial vascular flow voids present at skull base. SKULL AND UPPER CERVICAL SPINE: No abnormal sellar expansion. No suspicious calvarial bone marrow signal. Craniocervical junction maintained. Proximal cervical spinal cord compression and myelomalacia, which appears worse than 2015 though, will limited assessment . SINUSES/ORBITS: The mastoid air-cells and included paranasal sinuses are well-aerated. The included ocular globes and orbital contents are non-suspicious. Status post bilateral ocular lens implants. OTHER: None. IMPRESSION: 1. No acute intracranial process on this motion degraded examination. 2. Moderate chronic small vessel ischemic disease. 3. Partially imaged cord compression and myelomalacia, potentially worse than 2015 Electronically Signed   By: Awilda Metro M.D.   On: 05/08/2017 04:57    ____________________________________________   PROCEDURES  Procedure(s) performed: None  Procedures  Critical Care performed:  No  ____________________________________________   INITIAL IMPRESSION / ASSESSMENT AND PLAN / ED COURSE  Pertinent labs & imaging results that were available during my care of the patient were reviewed by me and considered in my medical decision making (see chart for details).  This is an 83 year old male who comes into the hospital today with an episode of unresponsiveness. The patient's daughter is concerned that he may have some anxiety causing his symptoms. I did check some blood work as well as a CT scan of the patient's head and an MRI of the brain. The patient's blood work is unremarkable as is the CT and the MRI. The MRI did show some worsened canal stenosis but the patient has had this problem in the past. I did give the patient a dose of Ativan 0.5mg  for him to be able to receive his CT scan and once he returned from MRI he was able to eat without any difficulty and walk with  his walker at his baseline. He was talking and sleeping well. As the workup at this time is unremarkable as feel it is appropriate to discharge the patient to home to have him follow-up with his primary care physician. The patient's daughter does agree. He'll be discharged to home.      ____________________________________________   FINAL CLINICAL IMPRESSION(S) / ED DIAGNOSES  Final diagnoses:  Disorientation  Weakness      NEW MEDICATIONS STARTED DURING THIS VISIT:  Discharge Medication List as of 05/08/2017  6:36 AM    START taking these medications   Details  LORazepam (ATIVAN) 0.5 MG tablet Take 1 tablet (0.5 mg total) by mouth every 8 (eight) hours as needed for anxiety., Starting Thu 05/08/2017, Until Fri 05/08/2018, Print         Note:  This document was prepared using Dragon voice recognition software and may include unintentional dictation errors.    Rebecka Apley, MD 05/08/17 [phone]

## 2017-05-08 NOTE — ED Notes (Signed)
Daughter's number to call if sleepers/shoes are found on EMS [phone] and ask for [name].

## 2017-05-09 ENCOUNTER — Telehealth: Payer: Self-pay | Admitting: Family Medicine

## 2017-05-09 NOTE — Telephone Encounter (Signed)
Maurine Minister can you please review. I know you have seen patient for this before.-aa

## 2017-05-09 NOTE — Telephone Encounter (Signed)
Got through on home phone number and talked to [facility]. Patient had a sudden worsening of muscle paralysis that progressed to panic. She took him to the ER and was given Ativan which helped ease his anxiety and muscles improved a little. She is afraid this could happen again over the Labor Day weekend. Advised to use the Ativan regularly and keep appointment with Dr. Sullivan Lone on 05-13-17. May have to go back to the ER if having Brachial Amyotrophic Diplegia affect his breathing. Daughter understood and agreed with this plan.

## 2017-05-09 NOTE — Telephone Encounter (Signed)
Pt daughter Larene Beach states pt is having a hard swallowing and his muscles in his neck, hand and arms are locking up and he can not move.  Pt daughter is asking what can she do when this happens.  Pt daughter is requesting a call back to discuss this. She is concerned with this happening over the weekend.   MH#[phone]/RX  This is a pt of Dr Sullivan Lone.

## 2017-05-09 NOTE — Telephone Encounter (Signed)
Tried calling to get more information but I was unable to leave a VM.    Thanks,   -Vernona Rieger

## 2017-05-13 ENCOUNTER — Ambulatory Visit (INDEPENDENT_AMBULATORY_CARE_PROVIDER_SITE_OTHER): Payer: Medicare Other | Admitting: Family Medicine

## 2017-05-13 ENCOUNTER — Encounter: Payer: Self-pay | Admitting: Family Medicine

## 2017-05-13 VITALS — BP 124/78 | HR 70 | Temp 97.8°F | Resp 16 | Wt 109.6 lb

## 2017-05-13 DIAGNOSIS — G710 Muscular dystrophy: Secondary | ICD-10-CM | POA: Diagnosis not present

## 2017-05-13 DIAGNOSIS — Z23 Encounter for immunization: Secondary | ICD-10-CM

## 2017-05-13 DIAGNOSIS — F419 Anxiety disorder, unspecified: Secondary | ICD-10-CM

## 2017-05-13 DIAGNOSIS — G1221 Amyotrophic lateral sclerosis: Secondary | ICD-10-CM | POA: Diagnosis not present

## 2017-05-13 DIAGNOSIS — G7102 Facioscapulohumeral muscular dystrophy: Secondary | ICD-10-CM

## 2017-05-13 MED ORDER — LORAZEPAM 0.5 MG PO TABS: 0.5000 mg | ORAL_TABLET | Freq: Three times a day (TID) | ORAL | 5 refills | Status: DC | PRN

## 2017-05-13 MED ORDER — LORAZEPAM 0.5 MG PO TABS: 0.5000 mg | ORAL_TABLET | Freq: Three times a day (TID) | ORAL | 5 refills | Status: AC | PRN

## 2017-05-13 NOTE — Progress Notes (Signed)
Patient: Scott Crane Male    DOB: Apr 23, 1930   83 y.o.   MRN: [id number] Visit Date: 05/13/2017  Today's Provider: Megan Mans, MD   Chief Complaint  Patient presents with  . Advice Only   Subjective:    HPI Pt and his daughter are here today to discuss home health, palliative care/ hospice. Pt daughter says that she would like to take him home with her to [place] so he is closer to her, but wants to know if this would be a good option for pt. Pt is continuing to get worse with his muscle atrophy and had a recent episode of not being able to move. He went to the ER and was given ativan and he was better, they told him that they thought it a type of panic and gave him 10 ativan pills, she has been cutting them in half and giving it to him but she thinks they are still a little too strong for him.   He also has a bed sore that they would like to have looked at to make sure it does not get infected.      Allergies  Allergen Reactions  . Duloxetine Other (See Comments)    Other reaction(s): Other (See Comments) Joint pain Joint pain     Current Outpatient Prescriptions:  .  acetaminophen (TYLENOL) 500 MG tablet, Take 500 mg by mouth every 8 (eight) hours as needed for mild pain., Disp: , Rfl:  .  baclofen (LIORESAL) 10 MG tablet, Take 1 tablet by mouth 3 (three) times daily. , Disp: , Rfl: 0 .  cinacalcet (SENSIPAR) 30 MG tablet, Take 30 mg by mouth 2 (two) times daily with a meal. , Disp: , Rfl:  .  LORazepam (ATIVAN) 0.5 MG tablet, Take 1 tablet (0.5 mg total) by mouth every 8 (eight) hours as needed for anxiety., Disp: 10 tablet, Rfl: 0 .  Magnesium Oxide 400 (240 Mg) MG TABS, take 1 tablet by mouth twice a day, Disp: 60 tablet, Rfl: 5 .  meclizine (ANTIVERT) 25 MG tablet, Take 25 mg by mouth 3 (three) times daily as needed for dizziness., Disp: , Rfl:  .  meloxicam (MOBIC) 7.5 MG tablet, Take 1 tablet (7.5 mg total) by mouth daily., Disp: 30 tablet,  Rfl: 5 .  Misc Natural Products (LUTEIN 20 PO), Take 1 capsule by mouth daily. , Disp: , Rfl:  .  Polyethyl Glycol-Propyl Glycol 0.4-0.3 % SOLN, Apply 1 drop to eye as needed (dry eyes). , Disp: , Rfl:  .  RA VITAMIN D-3 2000 units CAPS, Take 2,000 Units by mouth daily. , Disp: , Rfl: 0 .  traMADol (ULTRAM) 50 MG tablet, take 1 tablet by mouth every 6 hours if needed for pain, Disp: 90 tablet, Rfl: 5 .  triamterene-hydrochlorothiazide (MAXZIDE-25) 37.5-25 MG tablet, Take 1 tablet by mouth daily., Disp: 90 tablet, Rfl: 3  Review of Systems  Constitutional: Positive for fatigue.       Cachectic WM NAD He has difficulty standing erect due to muscular atrophy and weakness.  HENT: Negative.   Eyes: Negative.   Respiratory: Negative.   Cardiovascular: Negative.   Gastrointestinal: Negative.   Endocrine: Negative.   Genitourinary: Negative.   Musculoskeletal: Positive for gait problem.  Skin: Negative.   Allergic/Immunologic: Negative.   Neurological: Positive for weakness and numbness.  Hematological: Negative.     Social History  Substance Use Topics  . Smoking status: Former Smoker  Years: 5.00    Types: Cigarettes  . Smokeless tobacco: Never Used  . Alcohol use No   Objective:   BP 124/78 (BP Location: Left Arm, Patient Position: Sitting, Cuff Size: Normal)   Pulse 70   Temp 97.8 F (36.6 C) (Oral)   Resp 16   Wt 109 lb 9.6 oz (49.7 kg)   BMI 18.24 kg/m  Vitals:   05/13/17 1606  BP: 124/78  Pulse: 70  Resp: 16  Temp: 97.8 F (36.6 C)  TempSrc: Oral  Weight: 109 lb 9.6 oz (49.7 kg)     Physical Exam  Constitutional: He appears well-developed and well-nourished.  Eyes: Pupils are equal, round, and reactive to light. Conjunctivae and EOM are normal.  Neck: Normal range of motion. Neck supple.  Cardiovascular: Normal rate, regular rhythm, normal heart sounds and intact distal pulses.   Pulmonary/Chest: Effort normal and breath sounds normal.  Neurological: He is  alert.  Skin: Skin is warm and dry.  Psychiatric: He has a normal mood and affect. His behavior is normal. Judgment and thought content normal.        Assessment & Plan:     1. Facioscapulohumeral muscular dystrophy (HCC) Pt will need palliative care soon. This is discussed with pt/daughter.More than 50% of 30 minute visit spent in counseling. - Ambulatory referral to Home Health  2. Brachial amyotrophic diplegia (HCC)  - Ambulatory referral to Home Health  3. Anxiety  - LORazepam (ATIVAN) 0.5 MG tablet; Take 1 tablet (0.5 mg total) by mouth every 8 (eight) hours as needed for anxiety.  Dispense: 30 tablet; Refill: 5     HPI, Exam, and A&P Transcribed under the direction and in the presence of Darrel Gloss L. Wendelyn Breslow, MD  Electronically Signed: Silvio Pate, CMA   Jamonta Goerner Wendelyn Breslow, MD  [facility]

## 2017-05-14 ENCOUNTER — Ambulatory Visit: Payer: Medicare Other | Admitting: Family Medicine

## 2017-05-14 ENCOUNTER — Telehealth: Payer: Self-pay | Admitting: Family Medicine

## 2017-05-19 ENCOUNTER — Telehealth: Payer: Self-pay | Admitting: Family Medicine

## 2017-05-19 ENCOUNTER — Other Ambulatory Visit: Payer: Self-pay | Admitting: Family Medicine

## 2017-05-19 MED ORDER — MECLIZINE HCL 25 MG PO TABS: 25.0000 mg | ORAL_TABLET | Freq: Three times a day (TID) | ORAL | 3 refills | Status: AC | PRN

## 2017-05-19 NOTE — Telephone Encounter (Signed)
pt's daughter called saying her dad needs a 90 days supply of the Meclizine 25mg .  [facility] [address]  Pt's call back [phone]  Thanks Barth Kirks

## 2017-05-19 NOTE — Telephone Encounter (Signed)
Hospice palitive care nurse seen pt and suggest pt start receiving hospice care.  They will be faxing an order to you to complete.  Thanks, [employer]

## 2017-05-19 NOTE — Telephone Encounter (Signed)
fyi-Scott Crane, RMA  

## 2017-05-20 ENCOUNTER — Telehealth: Payer: Self-pay | Admitting: Family Medicine

## 2017-05-20 NOTE — Telephone Encounter (Signed)
Message in there from yesterday, I think I saw a fax with orders for Hospice sent back to you Dr Eual Fines, RMA

## 2017-05-20 NOTE — Telephone Encounter (Signed)
Debra with Hospice is calling to follow up on a verbal order and faxed order for Hospice services.  CB#[phone]/MW °

## 2017-05-21 NOTE — Telephone Encounter (Signed)
Order completed and faxed over-Markala Sitts Ander Purpura, RMA

## 2017-05-22 NOTE — Telephone Encounter (Signed)
Verlon Au with Hospice is requesting a call back about a Hospice referral.  CB#[phone]/MW

## 2017-05-22 NOTE — Telephone Encounter (Signed)
Spoke with Verlon Au she just wanted to make sure that the order for Hospice was received and that we faxed it back, which we did and she said she did get notification about it. No further actions are needed at this [employer], RMA

## 2017-05-26 ENCOUNTER — Other Ambulatory Visit: Payer: Self-pay

## 2017-05-26 ENCOUNTER — Telehealth: Payer: Self-pay | Admitting: Family Medicine

## 2017-05-26 MED ORDER — FUROSEMIDE 20 MG PO TABS: 20.0000 mg | ORAL_TABLET | ORAL | 11 refills | Status: AC

## 2017-05-26 NOTE — Telephone Encounter (Signed)
Virgie with Hospice called states she seen today and pt has swelling in his feet  and lower legs.  Pt is keeping his feel elevated but the swelling is still there.  She is asking if pt will need additional medication to help with swelling.  She will need a verbal order for medication change.  CB#[phone]/MW

## 2017-05-26 NOTE — Telephone Encounter (Signed)
Try lasix 20 mg q am,#30,11rf.

## 2017-05-26 NOTE — Telephone Encounter (Signed)
Spoke with Scott Crane and advised her of below, She will call it in to the pharmacy. Medication updated in the [employer], RMA

## 2017-05-26 NOTE — Telephone Encounter (Signed)
Please review-Scott Crane Scott Crane, RMA  

## 2017-06-06 LAB — HM DIABETES EYE EXAM

## 2017-06-09 ENCOUNTER — Encounter: Payer: Self-pay | Admitting: Ophthalmology

## 2017-07-21 ENCOUNTER — Ambulatory Visit: Payer: Medicare Other | Admitting: Family Medicine

## 2017-08-12 ENCOUNTER — Ambulatory Visit: Payer: Self-pay | Admitting: Family Medicine

## 2017-12-11 NOTE — Telephone Encounter (Signed)
complete

## 2018-04-09 DEATH — deceased
